# Patient Record
Sex: Female | Born: 1965 | Race: Black or African American | Hispanic: No | Marital: Single | Smoking: Never smoker
Health system: Southern US, Community
[De-identification: ages and names within clinical notes are randomized; demographics above are authoritative.]

## PROBLEM LIST (undated history)

## (undated) DIAGNOSIS — R011 Cardiac murmur, unspecified: Secondary | ICD-10-CM

## (undated) DIAGNOSIS — D509 Iron deficiency anemia, unspecified: Secondary | ICD-10-CM

## (undated) DIAGNOSIS — F419 Anxiety disorder, unspecified: Secondary | ICD-10-CM

## (undated) DIAGNOSIS — I38 Endocarditis, valve unspecified: Secondary | ICD-10-CM

## (undated) DIAGNOSIS — M1990 Unspecified osteoarthritis, unspecified site: Secondary | ICD-10-CM

## (undated) DIAGNOSIS — N83209 Unspecified ovarian cyst, unspecified side: Secondary | ICD-10-CM

## (undated) DIAGNOSIS — I10 Essential (primary) hypertension: Secondary | ICD-10-CM

## (undated) DIAGNOSIS — B1920 Unspecified viral hepatitis C without hepatic coma: Secondary | ICD-10-CM

## (undated) DIAGNOSIS — R000 Tachycardia, unspecified: Secondary | ICD-10-CM

## (undated) DIAGNOSIS — F1920 Other psychoactive substance dependence, uncomplicated: Secondary | ICD-10-CM

## (undated) HISTORY — DX: Tachycardia, unspecified: R00.0

## (undated) HISTORY — DX: Unspecified ovarian cyst, unspecified side: N83.209

## (undated) HISTORY — PX: DILATION AND CURETTAGE OF UTERUS: SHX78

## (undated) HISTORY — DX: Anxiety disorder, unspecified: F41.9

## (undated) HISTORY — DX: Cardiac murmur, unspecified: R01.1

---

## 2001-01-22 ENCOUNTER — Ambulatory Visit (HOSPITAL_COMMUNITY): Admission: RE | Admit: 2001-01-22 | Discharge: 2001-01-22 | Payer: Self-pay | Admitting: Gastroenterology

## 2001-01-22 ENCOUNTER — Encounter: Payer: Self-pay | Admitting: Gastroenterology

## 2001-02-07 ENCOUNTER — Encounter (INDEPENDENT_AMBULATORY_CARE_PROVIDER_SITE_OTHER): Payer: Self-pay | Admitting: *Deleted

## 2001-02-07 ENCOUNTER — Ambulatory Visit (HOSPITAL_COMMUNITY): Admission: RE | Admit: 2001-02-07 | Discharge: 2001-02-07 | Payer: Self-pay | Admitting: Gastroenterology

## 2001-02-07 ENCOUNTER — Encounter: Payer: Self-pay | Admitting: Gastroenterology

## 2001-05-16 ENCOUNTER — Emergency Department (HOSPITAL_COMMUNITY): Admission: EM | Admit: 2001-05-16 | Discharge: 2001-05-16 | Payer: Self-pay | Admitting: Emergency Medicine

## 2001-05-16 ENCOUNTER — Encounter: Payer: Self-pay | Admitting: Emergency Medicine

## 2002-09-18 ENCOUNTER — Emergency Department (HOSPITAL_COMMUNITY): Admission: EM | Admit: 2002-09-18 | Discharge: 2002-09-18 | Payer: Self-pay | Admitting: Emergency Medicine

## 2002-11-04 ENCOUNTER — Encounter: Admission: RE | Admit: 2002-11-04 | Discharge: 2002-11-04 | Payer: Self-pay | Admitting: *Deleted

## 2004-01-21 ENCOUNTER — Emergency Department (HOSPITAL_COMMUNITY): Admission: EM | Admit: 2004-01-21 | Discharge: 2004-01-21 | Payer: Self-pay | Admitting: Emergency Medicine

## 2009-05-04 ENCOUNTER — Encounter (INDEPENDENT_AMBULATORY_CARE_PROVIDER_SITE_OTHER): Payer: Self-pay | Admitting: *Deleted

## 2009-05-27 ENCOUNTER — Ambulatory Visit: Payer: Self-pay | Admitting: Gastroenterology

## 2009-06-17 ENCOUNTER — Ambulatory Visit (HOSPITAL_COMMUNITY): Admission: RE | Admit: 2009-06-17 | Discharge: 2009-06-17 | Payer: Self-pay | Admitting: Gastroenterology

## 2009-06-17 ENCOUNTER — Encounter (INDEPENDENT_AMBULATORY_CARE_PROVIDER_SITE_OTHER): Payer: Self-pay | Admitting: Interventional Radiology

## 2009-07-15 ENCOUNTER — Emergency Department (HOSPITAL_COMMUNITY): Admission: EM | Admit: 2009-07-15 | Discharge: 2009-07-15 | Payer: Self-pay | Admitting: Emergency Medicine

## 2009-07-23 ENCOUNTER — Encounter: Admission: RE | Admit: 2009-07-23 | Discharge: 2009-07-29 | Payer: Self-pay | Admitting: Emergency Medicine

## 2009-07-30 ENCOUNTER — Encounter (HOSPITAL_COMMUNITY): Admission: RE | Admit: 2009-07-30 | Discharge: 2009-09-01 | Payer: Self-pay | Admitting: Orthopaedic Surgery

## 2009-09-02 ENCOUNTER — Encounter (HOSPITAL_COMMUNITY): Admission: RE | Admit: 2009-09-02 | Discharge: 2009-10-02 | Payer: Self-pay | Admitting: Orthopaedic Surgery

## 2009-10-12 ENCOUNTER — Emergency Department (HOSPITAL_COMMUNITY): Admission: EM | Admit: 2009-10-12 | Discharge: 2009-10-12 | Payer: Self-pay | Admitting: Emergency Medicine

## 2010-02-19 ENCOUNTER — Emergency Department (HOSPITAL_COMMUNITY): Admission: EM | Admit: 2010-02-19 | Discharge: 2010-02-20 | Payer: Self-pay | Admitting: Emergency Medicine

## 2010-03-15 ENCOUNTER — Emergency Department (HOSPITAL_COMMUNITY): Admission: EM | Admit: 2010-03-15 | Discharge: 2010-03-15 | Payer: Self-pay | Admitting: Emergency Medicine

## 2010-11-03 ENCOUNTER — Emergency Department (HOSPITAL_COMMUNITY)
Admission: EM | Admit: 2010-11-03 | Discharge: 2010-11-03 | Payer: Self-pay | Source: Home / Self Care | Admitting: Emergency Medicine

## 2011-01-16 LAB — URINALYSIS, ROUTINE W REFLEX MICROSCOPIC
Ketones, ur: NEGATIVE mg/dL
Nitrite: NEGATIVE
Protein, ur: 30 mg/dL — AB

## 2011-01-17 LAB — URINALYSIS, ROUTINE W REFLEX MICROSCOPIC
Glucose, UA: NEGATIVE mg/dL
Specific Gravity, Urine: >1.03 — ABNORMAL HIGH (ref 1.005–1.030)
Urobilinogen, UA: 0.2 mg/dL (ref 0.0–1.0)
pH: 5 (ref 5.0–8.0)

## 2011-01-17 LAB — DIFFERENTIAL
Basophils Relative: 1 % (ref 0–1)
Lymphocytes Relative: 58 % — ABNORMAL HIGH (ref 12–46)
Monocytes Absolute: 0.8 10*3/uL (ref 0.1–1.0)
Monocytes Relative: 13 % — ABNORMAL HIGH (ref 3–12)
Neutro Abs: 1.7 10*3/uL (ref 1.7–7.7)

## 2011-01-17 LAB — CBC
Hemoglobin: 12.5 g/dL (ref 12.0–15.0)
MCHC: 35 g/dL (ref 30.0–36.0)
RBC: 3.91 MIL/uL (ref 3.87–5.11)

## 2011-01-17 LAB — URINE MICROSCOPIC-ADD ON

## 2011-01-17 LAB — BASIC METABOLIC PANEL
CO2: 24 mEq/L (ref 19–32)
Calcium: 9.1 mg/dL (ref 8.4–10.5)
GFR calc Af Amer: >60 mL/min (ref >60)
Sodium: 139 mEq/L (ref 135–145)

## 2011-01-17 LAB — RAPID URINE DRUG SCREEN, HOSP PERFORMED
Amphetamines: NOT DETECTED
Barbiturates: NOT DETECTED

## 2011-02-04 LAB — CBC
HCT: 36 % (ref 36.0–46.0)
Hemoglobin: 12.4 g/dL (ref 12.0–15.0)
MCHC: 34.3 g/dL (ref 30.0–36.0)
MCV: 95.5 fL (ref 78.0–100.0)
RDW: 13.9 % (ref 11.5–15.5)

## 2012-01-30 ENCOUNTER — Encounter (HOSPITAL_COMMUNITY): Payer: Self-pay | Admitting: *Deleted

## 2012-01-30 ENCOUNTER — Emergency Department (HOSPITAL_COMMUNITY)
Admission: EM | Admit: 2012-01-30 | Discharge: 2012-01-30 | Disposition: A | Discharge status: Home / Self Care | Payer: Medicaid Other | Attending: Emergency Medicine | Admitting: Emergency Medicine

## 2012-01-30 DIAGNOSIS — N750 Cyst of Bartholin's gland: Secondary | ICD-10-CM | POA: Insufficient documentation

## 2012-01-30 DIAGNOSIS — I10 Essential (primary) hypertension: Secondary | ICD-10-CM | POA: Insufficient documentation

## 2012-01-30 HISTORY — DX: Essential (primary) hypertension: I10

## 2012-01-30 MED ORDER — LIDOCAINE-EPINEPHRINE (PF) 1 %-1:200000 IJ SOLN
10.0000 mL | Freq: Once | INTRAMUSCULAR | Status: AC
  Administered 2012-01-30: 10 mL via INTRADERMAL
  Filled 2012-01-30: qty 10

## 2012-01-30 MED ORDER — OXYCODONE-ACETAMINOPHEN 5-325 MG PO TABS: 1.0000 | ORAL_TABLET | ORAL | Status: AC | PRN

## 2012-01-30 MED ORDER — OXYCODONE-ACETAMINOPHEN 5-325 MG PO TABS
ORAL_TABLET | ORAL | Status: AC
  Filled 2012-01-30: qty 1

## 2012-01-30 MED ORDER — FENTANYL CITRATE 0.05 MG/ML IJ SOLN
25.0000 ug | Freq: Once | INTRAMUSCULAR | Status: AC
  Administered 2012-01-30: 25 ug via INTRAVENOUS

## 2012-01-30 MED ORDER — ETOMIDATE 2 MG/ML IV SOLN
10.0000 mg | Freq: Once | INTRAVENOUS | Status: AC
  Administered 2012-01-30: 10 mg via INTRAVENOUS
  Filled 2012-01-30: qty 10

## 2012-01-30 MED ORDER — FENTANYL CITRATE 0.05 MG/ML IJ SOLN
50.0000 ug | Freq: Once | INTRAMUSCULAR | Status: AC
  Administered 2012-01-30: 50 ug via INTRAVENOUS
  Filled 2012-01-30: qty 2

## 2012-01-30 MED ORDER — ONDANSETRON HCL 4 MG/2ML IJ SOLN
4.0000 mg | Freq: Once | INTRAMUSCULAR | Status: AC
  Administered 2012-01-30: 4 mg via INTRAVENOUS
  Filled 2012-01-30: qty 2

## 2012-01-30 MED ORDER — OXYCODONE-ACETAMINOPHEN 5-325 MG PO TABS
1.0000 | ORAL_TABLET | Freq: Once | ORAL | Status: AC
  Administered 2012-01-30: 1 via ORAL

## 2012-01-30 MED ORDER — METRONIDAZOLE 500 MG PO TABS: 500.0000 mg | ORAL_TABLET | Freq: Two times a day (BID) | ORAL | Status: AC

## 2012-01-30 MED ORDER — CIPROFLOXACIN HCL 500 MG PO TABS: 500.0000 mg | ORAL_TABLET | Freq: Two times a day (BID) | ORAL | Status: AC

## 2012-01-30 NOTE — ED Notes (Signed)
Pt awake/ alert @ baseline at time of discharge. Pt will have S.O. Stay with her today/night at home. NAD. Pt able to tolerate po fluids with pain med.  NAD>

## 2012-01-30 NOTE — ED Notes (Signed)
Pt states abscess to vaginal area. First noticed 3- 4 weeks ago. Pain became worse over past two days. Nausea.

## 2012-01-30 NOTE — ED Provider Notes (Signed)
History   This chart was scribed for Hilario Quarry, MD by Clarita Crane. The patient was seen in room APA06/APA06. Patient's care was started at 0437.    CSN: [id number]  Arrival date & time 01/30/12  3295   First MD Initiated Contact with Patient 01/30/12 0700      Chief Complaint  Patient presents with  . Abscess    vaginal    (Consider location/radiation/quality/duration/timing/severity/associated sxs/prior treatment) HPI Brandi Giles is a 46 y.o. female who presents to the Emergency Department complaining of moderate to severe cyst to vaginal region onset several weeks ago and gradually worsening since with associated nausea and pain to region the past several days. Patient states that cyst was initially the size of a pimple and has grown since onset. Denies fever, chills, dysuria, vaginal bleeding, chance of pregnancy and history of similar symptoms. States she last drank/ate last night. Patient with h/o HTN, tubal ligation and denies h/o diabetes  Past Medical History  Diagnosis Date  . Hypertension     Past Surgical History  Procedure Date  . Cesarean section     No family history on file.  History  Substance Use Topics  . Smoking status: Never Smoker   . Smokeless tobacco: Not on file  . Alcohol Use: Yes     occasionally    OB History    Grav Para Term Preterm Abortions TAB SAB Ect Mult Living                  Review of Systems 10 Systems reviewed and all are negative for acute change except as noted in the HPI.   Allergies  Review of patient's allergies indicates no known allergies.  Home Medications  No current outpatient prescriptions on file.  BP 165/113  Pulse 85  Temp(Src) 98.2 F (36.8 C) (Oral)  Resp 20  Ht 5\' 5"  (1.651 m)  Wt 159 lb (72.122 kg)  BMI 26.46 kg/m2  SpO2 100%  LMP 01/26/2012  Physical Exam  Nursing note and vitals reviewed. Constitutional: She is oriented to person, place, and time. She appears well-developed and  well-nourished. No distress.  HENT:  Head: Normocephalic and atraumatic.  Eyes: EOM are normal. Pupils are equal, round, and reactive to light.  Neck: Neck supple. No tracheal deviation present.  Cardiovascular: Normal rate.   Pulmonary/Chest: Effort normal. No respiratory distress.  Abdominal: Soft. She exhibits no distension.  Genitourinary:       5cm Bartholin cyst to labia majora. Chaperone present for exam.   Musculoskeletal: Normal range of motion. She exhibits no edema.  Neurological: She is alert and oriented to person, place, and time. No sensory deficit.  Skin: Skin is warm and dry.  Psychiatric: She has a normal mood and affect. Her behavior is normal.    ED Course  Procedures (including critical care time)  CONSCIOUS SEDATION PROCEDURE NOTE: Patient identification was confirmed and patient consent was obtain verbally. The procedure was conducted at 8:01 AM and performed by Hilario Quarry, MD.. Anesthetic used: Etomidate, 10mg  IV Length of Time: 10 minutes Other medications administered: Fentanyl 50mg  IV followed by Fentanyl 25mg  IV Pre-procedure neuro examination revealed no deficits. Patient's airway remained patent, O2SAT adequate through procedure.  Vitals remained stable.  Patient tolerated procedure well without complications.  Post-procedure exam showed patient w/o cranial deficits.  Sensory and motor intact.  Patient returned to baseline prior to disposition. Instructions for care discussed verbally and pt provided with additional written instructions for homecare  and f/u. 8:34 AM Patient awake and alert post op instructions given. INCISION AND DRAINAGE PROCEDURE NOTE: Patient identification was confirmed and verbal consent was obtained. This procedure was performed by Hilario Quarry, MD at 8:01 AM. Site: Labia Majora Sterile procedures observed Anesthetic used (type and amt): 1% Lidocaine with Epinephrine, 2ml Blade size: 11 Drainage: serosanguinous,  moderate Complexity: Complex Site anesthetized, 3cm incision made over site, wound drained and explored loculations, rinsed with copious amounts of normal saline, wound packed with sterile gauze, covered with dry, sterile dressing.  Pt tolerated procedure well without complications.  Instructions for care discussed verbally and pt provided with additional written instructions for homecare and f/u.   DIAGNOSTIC STUDIES: Oxygen Saturation is 100% on room air, normal by my interpretation.    COORDINATION OF CARE: 7:16AM-Patient informed of current plan for treatment and evaluation and agrees with plan at this time. Patient informed of risks of conscious sedation. 8:01AM- Hilario Quarry, MD to bedside to perform conscious sedation and procedure.   Labs Reviewed - No data to display No results found.   No diagnosis found.   I personally performed the services described in this documentation, which was scribed in my presence. The recorded information has been reviewed and considered.         Hilario Quarry, MD 01/30/12 [phone]

## 2012-01-30 NOTE — ED Notes (Signed)
C/o ?vaginal abscess x 1 month, gradually getting larger and more painful.

## 2012-01-30 NOTE — ED Notes (Signed)
Ambu bag, suction, code cart at bedside prior to procedure. Pt on monitor. O2 @ 2L [place].

## 2012-01-30 NOTE — Discharge Instructions (Signed)
Bartholin's Cyst and Abscess  Bartholin's glands produce mucus through small openings just outside the opening of the vagina. The mucus helps with lubrication around the vagina during sexual intercourse. If the duct becomes clogged, the gland will swell and cause a bulge on the inside of the vagina. If this becomes big enough, it can be seen and felt on the outside of the vagina as well. Sometimes, the swelling will shrink away by itself. However, if the cyst becomes infected, the Bartholin's cyst fills with pus and becomes more swollen, red and painful and becomes a Bartholin's abscess. This usually requires antibiotic treatment and surgical drainage. Sometimes, with minor surgery under local anesthesia, a small tube is placed in the cyst or abscess wall. This allows continued drainage for up to 6 weeks. Minor surgery can make a new opening to replace the clogged duct and help prevent future cysts or abscess.  If the abscess occurs several times, a minor operation with local anesthesia is necessary to remove the Bartholin's gland completely or to make it drain better. Cutting open the gland and suturing the edges to make the opening of the gland bigger (marsupialization) may be needed and should usually be done by your obstetrician-gyncology physician. Antibiotics are usually prescribed for this condition. Take all antibiotics as prescribed. Make sure to finish them even if you are doing better. Take warm sitz baths for 20 minutes, 3 times a day. See your caregiver for follow-up care as recommended.  SEEK MEDICAL CARE IF:    You have increasing pain, swelling, or redness near the vagina.   You have vomiting or inability to tolerate medicines.   You have a fever.   You have uncontrolled bleeding from the vagina.  Document Released: 11/23/2004 Document Revised: 10/05/2011 Document Reviewed: 11/26/2009  ExitCare Patient Information 2012 [employer].

## 2012-01-30 NOTE — ED Notes (Signed)
Pt is awake, asking if procedure has been performed. Steward scale at baseline of 6. Able to follow commands and answer questions appropriately.

## 2012-08-07 ENCOUNTER — Other Ambulatory Visit (HOSPITAL_COMMUNITY): Payer: Self-pay | Admitting: Nurse Practitioner

## 2012-08-07 DIAGNOSIS — Z139 Encounter for screening, unspecified: Secondary | ICD-10-CM

## 2012-08-12 ENCOUNTER — Ambulatory Visit (HOSPITAL_COMMUNITY): Payer: Medicaid Other

## 2013-03-03 ENCOUNTER — Emergency Department (HOSPITAL_COMMUNITY)
Admission: EM | Admit: 2013-03-03 | Discharge: 2013-03-03 | Disposition: A | Discharge status: Home / Self Care | Payer: MEDICAID | Attending: Emergency Medicine | Admitting: Emergency Medicine

## 2013-03-03 ENCOUNTER — Encounter (HOSPITAL_COMMUNITY): Payer: Self-pay | Admitting: *Deleted

## 2013-03-03 DIAGNOSIS — G479 Sleep disorder, unspecified: Secondary | ICD-10-CM | POA: Insufficient documentation

## 2013-03-03 DIAGNOSIS — F1410 Cocaine abuse, uncomplicated: Secondary | ICD-10-CM

## 2013-03-03 DIAGNOSIS — Z3202 Encounter for pregnancy test, result negative: Secondary | ICD-10-CM | POA: Insufficient documentation

## 2013-03-03 DIAGNOSIS — I10 Essential (primary) hypertension: Secondary | ICD-10-CM | POA: Insufficient documentation

## 2013-03-03 DIAGNOSIS — F329 Major depressive disorder, single episode, unspecified: Secondary | ICD-10-CM | POA: Insufficient documentation

## 2013-03-03 DIAGNOSIS — F411 Generalized anxiety disorder: Secondary | ICD-10-CM | POA: Insufficient documentation

## 2013-03-03 DIAGNOSIS — R450 Nervousness: Secondary | ICD-10-CM | POA: Insufficient documentation

## 2013-03-03 DIAGNOSIS — Z8619 Personal history of other infectious and parasitic diseases: Secondary | ICD-10-CM | POA: Insufficient documentation

## 2013-03-03 DIAGNOSIS — F3289 Other specified depressive episodes: Secondary | ICD-10-CM | POA: Insufficient documentation

## 2013-03-03 HISTORY — DX: Unspecified viral hepatitis C without hepatic coma: B19.20

## 2013-03-03 LAB — CBC WITH DIFFERENTIAL/PLATELET
Basophils Relative: 0 % (ref 0–1)
Eosinophils Absolute: 0.2 10*3/uL (ref 0.0–0.7)
HCT: 29.1 % — ABNORMAL LOW (ref 36.0–46.0)
Hemoglobin: 9.2 g/dL — ABNORMAL LOW (ref 12.0–15.0)
MCH: 22.4 pg — ABNORMAL LOW (ref 26.0–34.0)
MCHC: 31.6 g/dL (ref 30.0–36.0)
MCV: 70.8 fL — ABNORMAL LOW (ref 78.0–100.0)
Monocytes Absolute: 0.6 10*3/uL (ref 0.1–1.0)
Monocytes Relative: 12 % (ref 3–12)

## 2013-03-03 LAB — BASIC METABOLIC PANEL
BUN: 19 mg/dL (ref 6–23)
Creatinine, Ser: 0.86 mg/dL (ref 0.50–1.10)
GFR calc Af Amer: >90 mL/min (ref >90)
GFR calc non Af Amer: 79 mL/min — ABNORMAL LOW (ref >90)
Glucose, Bld: 89 mg/dL (ref 70–99)

## 2013-03-03 LAB — URINALYSIS, ROUTINE W REFLEX MICROSCOPIC
Ketones, ur: NEGATIVE mg/dL
Leukocytes, UA: NEGATIVE
Nitrite: NEGATIVE
Protein, ur: NEGATIVE mg/dL

## 2013-03-03 LAB — POCT PREGNANCY, URINE: Preg Test, Ur: NEGATIVE

## 2013-03-03 LAB — RAPID URINE DRUG SCREEN, HOSP PERFORMED: Opiates: NOT DETECTED

## 2013-03-03 NOTE — ED Notes (Signed)
Pt here  For detox,  Has been using crack

## 2013-03-03 NOTE — ED Provider Notes (Signed)
History     This chart was scribed for Flint Melter, MD, MD by Smitty Pluck, ED Scribe. The patient was seen in room APA10/APA10 and the patient's care was started at 3:47 PM.   CSN: [id number]  Arrival date & time 03/03/13  1334       No chief complaint on file.    The history is provided by the patient and medical records. No language interpreter was used.   Brandi Giles is a 47 y.o. female who presents to the Emergency Department wanting detox from crack cocaine. She reports that she has been using crack for the past 4 days and has not been home with fiance. She last used crack 1 day ago. She reports that she is suppose to take anti-depression medicine but she does not have doctor to prescribe the medicine. She report that she is depressed and anxious. She mentions she is unable to sleep at night. She states she has lost 40 lbs within the past 2 months. She reports that she had tubal ligation 16 years ago. She is suppose to be seen at mental health facility 04-13-13 and she last went there last week for outpatient services. She states she was referred to ED. Pt denies SI, HI, hallucinations,  fever, chills, nausea, vomiting, diarrhea, weakness, cough, SOB and any other pain. She denies using any other illegal drugs and alcohol.    Past Medical History  Diagnosis Date  . Hypertension   . Hepatitis C     Past Surgical History  Procedure Laterality Date  . Cesarean section      History reviewed. No pertinent family history.  History  Substance Use Topics  . Smoking status: Never Smoker   . Smokeless tobacco: Not on file  . Alcohol Use: Yes     Comment: occasionally    OB History   Grav Para Term Preterm Abortions TAB SAB Ect Mult Living                  Review of Systems  Constitutional: Negative for fever and chills.  Psychiatric/Behavioral: Negative for suicidal ideas, hallucinations, confusion and self-injury. The patient is nervous/anxious.   All other  systems reviewed and are negative.    Allergies  Review of patient's allergies indicates no known allergies.  Home Medications   Current Outpatient Rx  Name  Route  Sig  Dispense  Refill  . naproxen sodium (ALEVE) 220 MG tablet   Oral   Take 660 mg by mouth once as needed (for headache pain).           BP 166/115  Pulse 86  Temp(Src) 97.2 F (36.2 C) (Oral)  Resp 22  Ht 5\' 5"  (1.651 m)  Wt 125 lb (56.7 kg)  BMI 20.8 kg/m2  SpO2 100%  LMP 01/01/2013  Physical Exam  Nursing note and vitals reviewed. Constitutional: She is oriented to person, place, and time. She appears well-developed and well-nourished.  HENT:  Head: Normocephalic and atraumatic.  Eyes: Conjunctivae and EOM are normal. Pupils are equal, round, and reactive to light.  Neck: Normal range of motion and phonation normal. Neck supple.  Cardiovascular: Normal rate, regular rhythm and intact distal pulses.   Pulmonary/Chest: Effort normal and breath sounds normal. She exhibits no tenderness.  Abdominal: Soft. She exhibits no distension. There is no tenderness. There is no guarding.  Musculoskeletal: Normal range of motion.  Neurological: She is alert and oriented to person, place, and time. She has normal strength. She  exhibits normal muscle tone.  Skin: Skin is warm and dry.  Psychiatric: She has a normal mood and affect. Her behavior is normal. Judgment and thought content normal.    ED Course  Procedures (including critical care time) DIAGNOSTIC STUDIES: Oxygen Saturation is 100% on room air, normal by my interpretation.   Filed Vitals:   03/03/13 1359  BP: 166/115  Pulse: 86  Temp: 97.2 F (36.2 C)  TempSrc: Oral  Resp: 22  Height: 5\' 5"  (1.651 m)  Weight: 125 lb (56.7 kg)  SpO2: 100%    I discussed the case with the on-call assessment crisis team. Patsy Lager, states that the patient should followup with [facility] tomorrow. She agrees that inpatient detoxification for cocaine, is not  indicated.   COORDINATION OF CARE: 3:50 PM Discussed ED treatment with pt and pt agrees.   4:01 PM Discussed post ED plans with pt.    Labs Reviewed  CBC WITH DIFFERENTIAL - Abnormal; Notable for the following:    Hemoglobin 9.2 (*)    HCT 29.1 (*)    MCV 70.8 (*)    MCH 22.4 (*)    RDW 17.0 (*)    Neutrophils Relative 39 (*)    All other components within normal limits  BASIC METABOLIC PANEL - Abnormal; Notable for the following:    Sodium 133 (*)    GFR calc non Af Amer 79 (*)    All other components within normal limits  ETHANOL  URINALYSIS, ROUTINE W REFLEX MICROSCOPIC  URINE RAPID DRUG SCREEN (HOSP PERFORMED)  POCT PREGNANCY, URINE      1. Cocaine abuse   2. Depression       MDM  Substance abuse, cocaine, depression. She is a chronic user. She is not suicidal or homicidal. She is stable for discharge. Doubt metabolic instability, serious bacterial infection or impending vascular collapse; the patient is stable for discharge.  Nursing Notes Reviewed/ Care Coordinated, and agree without changes. Applicable Imaging Reviewed.  Interpretation of Laboratory Data incorporated into ED treatment  Plan: Home Medications- none; Home Treatments- Avoid Cocaine; [facility]     I personally performed the services described in this documentation, which was scribed in my presence. The recorded information has been reviewed and is accurate.    Flint Melter, MD 03/03/13 [phone]

## 2013-03-10 ENCOUNTER — Emergency Department: Payer: Self-pay | Admitting: Emergency Medicine

## 2013-03-10 LAB — COMPREHENSIVE METABOLIC PANEL
Albumin: 4.2 g/dL (ref 3.4–5.0)
Alkaline Phosphatase: 56 U/L (ref 50–136)
Bilirubin,Total: 0.6 mg/dL (ref 0.2–1.0)
Calcium, Total: 9.1 mg/dL (ref 8.5–10.1)
EGFR (African American): >60
EGFR (Non-African Amer.): >60
Glucose: 95 mg/dL (ref 65–99)
Osmolality: 270 (ref 275–301)
Potassium: 4.1 mmol/L (ref 3.5–5.1)
SGPT (ALT): 168 U/L — ABNORMAL HIGH (ref 12–78)
Total Protein: 9.8 g/dL — ABNORMAL HIGH (ref 6.4–8.2)

## 2013-03-10 LAB — URINALYSIS, COMPLETE
Blood: NEGATIVE
Glucose,UR: NEGATIVE mg/dL (ref 0–75)
Hyaline Cast: 5
Nitrite: NEGATIVE
Ph: 6 (ref 4.5–8.0)
WBC UR: 15 /HPF (ref 0–5)

## 2013-03-10 LAB — DRUG SCREEN, URINE
Barbiturates, Ur Screen: NEGATIVE (ref ?–200)
Cannabinoid 50 Ng, Ur ~~LOC~~: NEGATIVE (ref ?–50)
Tricyclic, Ur Screen: NEGATIVE (ref ?–1000)

## 2013-03-10 LAB — TSH: Thyroid Stimulating Horm: 1.86 u[IU]/mL

## 2013-03-10 LAB — CBC
HCT: 29.9 % — ABNORMAL LOW (ref 35.0–47.0)
HGB: 9.4 g/dL — ABNORMAL LOW (ref 12.0–16.0)
MCH: 22.2 pg — ABNORMAL LOW (ref 26.0–34.0)
Platelet: 283 10*3/uL (ref 150–440)
RBC: 4.23 10*6/uL (ref 3.80–5.20)
WBC: 8.7 10*3/uL (ref 3.6–11.0)

## 2013-03-10 LAB — ETHANOL
Ethanol %: <0.003 % (ref 0.000–0.080)
Ethanol: <3 mg/dL

## 2013-03-10 LAB — SALICYLATE LEVEL: Salicylates, Serum: <1.7 mg/dL

## 2013-03-10 LAB — ACETAMINOPHEN LEVEL: Acetaminophen: <2 ug/mL

## 2013-04-04 ENCOUNTER — Inpatient Hospital Stay (HOSPITAL_COMMUNITY)
Admission: AD | Admit: 2013-04-04 | Discharge: 2013-04-08 | DRG: 897 | Disposition: A | Discharge status: Home / Self Care | Payer: Medicaid Other | Source: Intra-hospital | Attending: Psychiatry | Admitting: Psychiatry

## 2013-04-04 ENCOUNTER — Emergency Department (HOSPITAL_COMMUNITY)
Admission: EM | Admit: 2013-04-04 | Discharge: 2013-04-04 | Disposition: A | Discharge status: Other Acute Inpatient Hospital | Payer: Medicaid Other | Source: Home / Self Care | Attending: Emergency Medicine | Admitting: Emergency Medicine

## 2013-04-04 ENCOUNTER — Encounter (HOSPITAL_COMMUNITY): Payer: Self-pay

## 2013-04-04 DIAGNOSIS — R45851 Suicidal ideations: Secondary | ICD-10-CM | POA: Insufficient documentation

## 2013-04-04 DIAGNOSIS — F1410 Cocaine abuse, uncomplicated: Secondary | ICD-10-CM | POA: Insufficient documentation

## 2013-04-04 DIAGNOSIS — Z8619 Personal history of other infectious and parasitic diseases: Secondary | ICD-10-CM | POA: Insufficient documentation

## 2013-04-04 DIAGNOSIS — I10 Essential (primary) hypertension: Secondary | ICD-10-CM | POA: Diagnosis present

## 2013-04-04 DIAGNOSIS — F3289 Other specified depressive episodes: Secondary | ICD-10-CM | POA: Insufficient documentation

## 2013-04-04 DIAGNOSIS — Z862 Personal history of diseases of the blood and blood-forming organs and certain disorders involving the immune mechanism: Secondary | ICD-10-CM | POA: Insufficient documentation

## 2013-04-04 DIAGNOSIS — Z79899 Other long term (current) drug therapy: Secondary | ICD-10-CM

## 2013-04-04 DIAGNOSIS — Z3202 Encounter for pregnancy test, result negative: Secondary | ICD-10-CM | POA: Insufficient documentation

## 2013-04-04 DIAGNOSIS — F329 Major depressive disorder, single episode, unspecified: Secondary | ICD-10-CM | POA: Diagnosis present

## 2013-04-04 DIAGNOSIS — F1414 Cocaine abuse with cocaine-induced mood disorder: Secondary | ICD-10-CM

## 2013-04-04 HISTORY — DX: Other psychoactive substance dependence, uncomplicated: F19.20

## 2013-04-04 HISTORY — DX: Iron deficiency anemia, unspecified: D50.9

## 2013-04-04 LAB — ETHANOL: Alcohol, Ethyl (B): 14 mg/dL — ABNORMAL HIGH (ref 0–11)

## 2013-04-04 LAB — BASIC METABOLIC PANEL
BUN: 14 mg/dL (ref 6–23)
CO2: 19 mEq/L (ref 19–32)
Chloride: 97 mEq/L (ref 96–112)
Creatinine, Ser: 1 mg/dL (ref 0.50–1.10)
Glucose, Bld: 105 mg/dL — ABNORMAL HIGH (ref 70–99)

## 2013-04-04 LAB — CBC WITH DIFFERENTIAL/PLATELET
Basophils Absolute: 0 10*3/uL (ref 0.0–0.1)
HCT: 28.4 % — ABNORMAL LOW (ref 36.0–46.0)
Hemoglobin: 8.9 g/dL — ABNORMAL LOW (ref 12.0–15.0)
Lymphocytes Relative: 42 % (ref 12–46)
Lymphs Abs: 2.7 10*3/uL (ref 0.7–4.0)
Monocytes Absolute: 0.9 10*3/uL (ref 0.1–1.0)
Monocytes Relative: 14 % — ABNORMAL HIGH (ref 3–12)
Neutro Abs: 2.7 10*3/uL (ref 1.7–7.7)
RBC: 3.94 MIL/uL (ref 3.87–5.11)
WBC: 6.5 10*3/uL (ref 4.0–10.5)

## 2013-04-04 LAB — URINE MICROSCOPIC-ADD ON

## 2013-04-04 LAB — URINALYSIS, ROUTINE W REFLEX MICROSCOPIC
Glucose, UA: NEGATIVE mg/dL
Leukocytes, UA: NEGATIVE

## 2013-04-04 LAB — RAPID URINE DRUG SCREEN, HOSP PERFORMED
Amphetamines: NOT DETECTED
Benzodiazepines: NOT DETECTED

## 2013-04-04 MED ORDER — MAGNESIUM HYDROXIDE 400 MG/5ML PO SUSP: 30.0000 mL | Freq: Every day | ORAL | Status: DC | PRN

## 2013-04-04 MED ORDER — LORAZEPAM 1 MG PO TABS
2.0000 mg | ORAL_TABLET | Freq: Once | ORAL | Status: AC
  Administered 2013-04-04: 2 mg via ORAL
  Filled 2013-04-04: qty 2

## 2013-04-04 MED ORDER — NICOTINE 21 MG/24HR TD PT24: 21.0000 mg | MEDICATED_PATCH | Freq: Every day | TRANSDERMAL | Status: DC

## 2013-04-04 MED ORDER — ACETAMINOPHEN 325 MG PO TABS: 650.0000 mg | ORAL_TABLET | Freq: Four times a day (QID) | ORAL | Status: DC | PRN

## 2013-04-04 MED ORDER — ALUM & MAG HYDROXIDE-SIMETH 200-200-20 MG/5ML PO SUSP: 30.0000 mL | ORAL | Status: DC | PRN

## 2013-04-04 MED ORDER — TRAZODONE HCL 50 MG PO TABS
50.0000 mg | ORAL_TABLET | Freq: Every evening | ORAL | Status: DC | PRN
  Administered 2013-04-04 – 2013-04-07 (×4): 50 mg via ORAL
  Filled 2013-04-04 (×3): qty 1
  Filled 2013-04-04: qty 6
  Filled 2013-04-04: qty 1

## 2013-04-04 NOTE — ED Notes (Signed)
Pt states she has been smoking crack for about 20 years, states she was here sometime in the past week or so to get help, but was sent out and told there was no place for her to go.  Pt states she did not follow up because her medicaid wouldn't pay.  Pt states if she cannot get treatment and is sent home, she will kill herself.

## 2013-04-04 NOTE — Progress Notes (Signed)
Pt accepted by [facility] to Dr Elsie Saas room number to ber assigned.  Assessment will call when room is ready.  See support paperwork.

## 2013-04-04 NOTE — BH Assessment (Signed)
Assessment Note   Brandi Giles is an 47 y.o. female.   PT REPORTED TO [place] ER AFTER A 4 DAY CRACK/COCAINE BINGE. SHE HAS TRIED TO STOP ON HER OWN TO NO AVAIL.  SHE RECENTLY PRESENTED TO THE ER FOR HELP AND WAS GIVEN REFERRAL TO [facility]. HER APPOINTMENT IS NOT UNTIL July.  SHE WAS PREVIOUSLY TREATED FOR HER DEPRESSION BY HER PCP. DR [facility] BUT HAS NOT TAKEN HER WELLBUTRIN, ZOLOFT NOR PAXIL RECENTLY. SHE REPORTS HER OWN EFFORTS TO QUIT HAVE BEEN UNSUCCESSFUL.  PT REPORTS FEELING MORE DEPRESSED DURING THIS 4 DAY BINGE AND FEELS IF SHE GOES HOME SHE WILL KILL HERSELF BY OVERDOSING ON PILLS AS SHE NO LONGER WANTS TO LIVE THIS WAY.   SHE HAS BEEN USING FOR 20 YEARS WITH NO SIGNIFICANT DATES OF SOBRIETY.  SHE DENIES H/I AND IS NOT PSYCHOTIC NOR DELUSIONAL.    Axis I: Major Depression, Recurrent severe Crack/Cocaine Abuse Axis II: Deferred Axis III:  Past Medical History  Diagnosis Date  . Hypertension   . Hepatitis C   . Drug abuse and dependence    Axis IV: economic problems, occupational problems and other psychosocial or environmental problems Axis V: 11-20 some danger of hurting self or others possible OR occasionally fails to maintain minimal personal hygiene OR gross impairment in communication  Past Medical History:  Past Medical History  Diagnosis Date  . Hypertension   . Hepatitis C   . Drug abuse and dependence     Past Surgical History  Procedure Laterality Date  . Cesarean section      Family History: No family history on file.  Social History:  reports that she has never smoked. She does not have any smokeless tobacco history on file. She reports that  drinks alcohol. She reports that she uses illicit drugs (Cocaine).  Additional Social History:  Alcohol / Drug Use Pain Medications: na Prescriptions: na Over the Counter: na History of alcohol / drug use?: Yes Longest period of sobriety (when/how long): na Negative Consequences of Use: Financial;Personal  relationships Substance #1 Name of Substance 1: Crack/cocaine 1 - Age of First Use: 27 1 - Amount (size/oz): 3 grams  1 - Frequency: daily 1 - Duration: 2 years 1 - Last Use / Amount: this morning  last of a 4 day binge  CIWA: CIWA-Ar BP: 114/82 mmHg Pulse Rate: 88 COWS:    Allergies: No Known Allergies  Home Medications:  (Not in a hospital admission)  OB/GYN Status:  No LMP recorded.  General Assessment Data Location of Assessment: [facility] ACT Assessment: Yes Living Arrangements: Non-relatives/Friends Can pt return to current living arrangement?: Yes Admission Status: Voluntary Is patient capable of signing voluntary admission?: Yes [facility] Referral Source: MD (DR Fonnie Jarvis)     Risk to self Suicidal Ideation: Yes-Currently Present Suicidal Intent: Yes-Currently Present Is patient at risk for suicide?: Yes Suicidal Plan?: Yes-Currently Present Specify Current Suicidal Plan: TO OVERDOSE ON PILLS Access to Means: Yes Specify Access to Suicidal Means: CAN GET PILLS What has been your use of drugs/alcohol within the last 12 months?: CRACK/COCAINE Previous Attempts/Gestures: Yes How many times?: 2 Other Self Harm Risks: NA Triggers for Past Attempts: Family contact;Other personal contacts;Other (Comment) (COCAINE USE) Intentional Self Injurious Behavior: None Family Suicide History: No (MOTHER ATTEMPTED SUICIDE BUT SURVIVED) Recent stressful life event(s): Financial Problems;Other (Comment) (UNABLE TO STOP USING CRACK/COCAINE) Persecutory voices/beliefs?: No Depression: Yes Depression Symptoms: Despondent;Insomnia;Tearfulness;Isolating;Fatigue;Guilt;Loss of interest in usual pleasures;Feeling worthless/self pity;Feeling  angry/irritable Substance abuse history and/or treatment for substance abuse?: Yes Suicide prevention information given to non-admitted patients: Not applicable  Risk to Others Homicidal Ideation: No Thoughts of  Harm to Others: No Current Homicidal Intent: No Current Homicidal Plan: No Access to Homicidal Means: No Identified Victim: NA History of harm to others?: No Assessment of Violence: None Noted Violent Behavior Description: NA Does patient have access to weapons?: No Criminal Charges Pending?: No Does patient have a court date: No  Psychosis Hallucinations: None noted Delusions: None noted  Mental Status Report Appear/Hygiene: Disheveled Eye Contact: Fair Motor Activity: Freedom of movement Speech: Logical/coherent;Soft Level of Consciousness: Alert Mood: Depressed;Despair;Guilty;Helpless;Sad;Worthless, low self-esteem Affect: Appropriate to circumstance;Depressed;Fearful;Sad Anxiety Level: Minimal Thought Processes: Coherent;Relevant Judgement: Impaired Orientation: Person;Place;Time;Situation Obsessive Compulsive Thoughts/Behaviors: Minimal  Cognitive Functioning Concentration: Decreased Memory: Recent Intact;Remote Intact IQ: Average Insight: Poor Impulse Control: Poor Appetite: Fair Weight Loss: 0 Weight Gain: 0 Sleep: Decreased Total Hours of Sleep: 2 (ON 4 DAY COCAINE BINGE) Vegetative Symptoms: None  ADLScreening [facility] Assessment Services) Patient's cognitive ability adequate to safely complete daily activities?: Yes Patient able to express need for assistance with ADLs?: Yes Independently performs ADLs?: Yes (appropriate for developmental age)  Abuse/Neglect [facility]) Physical Abuse: Denies Verbal Abuse: Denies Sexual Abuse: Denies  Prior Inpatient Therapy Prior Inpatient Therapy: Yes Prior Therapy Dates: AGE 27. AND 2 YRS AGO Prior Therapy Facilty/Provider(s): [facility] IN [place] AT AGE 27 AND [place] [facility] 2 YRS AGO Reason for Treatment: SUICIDE ATTEMPT. AND SUICIDAL IDEATIONS  Prior Outpatient Therapy Prior Outpatient Therapy: No Prior Therapy Dates: NA Prior Therapy Facilty/Provider(s): NA Reason for Treatment: NA  ADL Screening (condition at  time of admission) Patient's cognitive ability adequate to safely complete daily activities?: Yes Patient able to express need for assistance with ADLs?: Yes Independently performs ADLs?: Yes (appropriate for developmental age) Weakness of Legs: None Weakness of Arms/Hands: None  Home Assistive Devices/Equipment Home Assistive Devices/Equipment: None  Therapy Consults (therapy consults require a physician order) PT Evaluation Needed: No OT Evalulation Needed: No SLP Evaluation Needed: No Abuse/Neglect Assessment (Assessment to be complete while patient is alone) Physical Abuse: Denies Verbal Abuse: Denies Sexual Abuse: Denies Exploitation of patient/patient's resources: Denies Self-Neglect: Denies Values / Beliefs Cultural Requests During Hospitalization: None Spiritual Requests During Hospitalization: None Consults Spiritual Care Consult Needed: No Social Work Consult Needed: No Merchant navy officer (For Healthcare) Advance Directive: Patient does not have advance directive;Patient would not like information Pre-existing out of facility DNR order (yellow form or pink MOST form): No    Additional Information 1:1 In Past 12 Months?: No CIRT Risk: No Elopement Risk: No Does patient have medical clearance?: Yes     Disposition: REFERRED TO [facility] Disposition Initial Assessment Completed for this Encounter: Yes Disposition of Patient: Inpatient treatment program Type of inpatient treatment program: Adult (REFERRED TO [facility])  On Site Evaluation by:   Reviewed with Physician:  DR AVWUJW AND DR Lynelle Doctor   Hattie Perch Winford 04/04/2013 8:51 AM

## 2013-04-04 NOTE — ED Notes (Signed)
Pt resting calmly w/ eyes closed. Rise & fall of the chest noted. Sitter at bedside. Bed in low position, side rails up x2. NAD noted at this time.  

## 2013-04-04 NOTE — ED Provider Notes (Signed)
Brandi Giles, ACT states accepted at [facility] but is waiting for a bed  Ward Givens, MD 04/04/13 1336

## 2013-04-04 NOTE — BH Assessment (Signed)
     Received referral for this patient from [facility], there is no appropriate bed at this time. [facility] Assessment Progress Note

## 2013-04-04 NOTE — ED Notes (Signed)
Attempted to call report to [facility] Adult Unit. Charge RN to call back. Report given to [facility].

## 2013-04-04 NOTE — ED Provider Notes (Signed)
This chart was scribed for Ward Givens, MD by Jiles Prows, ED Scribe. The patient was seen in room APA03/APA03 and the patient's care was started at 8:42 AM.   8:42 AM - Pt reports she is feeling better than when she came in.  Pt reports feeling minor withdrawal.  Discussed ED treatment with pt. She states she has talked to [place], [place] and she is trying to get her placement and pt agrees. Pt states that 10 years ago she went to rehab and stayed sober for 8 months. Pt has had telepsych consult by Dr Leretha Pol who recommends inpatient admission.  Devoria Albe, MD, FACEP   I personally performed the services described in this documentation, which was scribed in my presence. The recorded information has been reviewed and considered.  Devoria Albe, MD, Armando Gang    Ward Givens, MD 04/04/13 [phone]

## 2013-04-04 NOTE — ED Notes (Signed)
[facility] here to pick up pt 

## 2013-04-04 NOTE — BH Assessment (Signed)
[facility] Assessment Progress Note  Per Adult Unit, pt has been assigned to Rm [id number].  Doylene Canning, MA Assessment Counselor 04/04/2013 @ 18:26

## 2013-04-04 NOTE — ED Notes (Signed)
Pt asleep when this nurse entered the room. Pt came to the ER to get help from cocaine. States she has been using for 20 years, last used 2 hours ago. Pt states was seen here & DC home earlier this months & if she goes home again that she would kill herself. Pt in paper scrubs w/ sitter watching pt.

## 2013-04-04 NOTE — ED Provider Notes (Signed)
History     CSN: [id number]  Arrival date & time 04/04/13  0210   First MD Initiated Contact with Patient 04/04/13 [phone]      Chief Complaint  Patient presents with  . V70.1    (Consider location/radiation/quality/duration/timing/severity/associated sxs/prior treatment) HPI This 47 year old female has smoked crack cocaine for years and wants detox, she also states she's anxiety depression is no suicidal, the last time she was in the emergency department requesting detox from crack cocaine she was not suicidal but she has tonight now, she denies any threats or others, she denies any specific suicidal plan, she denies any hallucinations, she is no fever headache chest pain shortness breath abdominal pain vomiting or other concerns. There is no treatment prior to arrival. Past Medical History  Diagnosis Date  . Hypertension   . Hepatitis C   . Drug abuse and dependence   . Iron deficiency anemia     Past Surgical History  Procedure Laterality Date  . Cesarean section      No family history on file.  History  Substance Use Topics  . Smoking status: Never Smoker   . Smokeless tobacco: Not on file  . Alcohol Use: Yes     Comment: occasionally    OB History   Grav Para Term Preterm Abortions TAB SAB Ect Mult Living                  Review of Systems 10 Systems reviewed and are negative for acute change except as noted in the HPI. Allergies  Review of patient's allergies indicates no known allergies.  Home Medications   No current outpatient prescriptions on file.  BP 141/95  Pulse 83  Temp(Src) 98.7 F (37.1 C) (Oral)  Resp 18  Ht 5\' 5"  (1.651 m)  Wt 140 lb (63.504 kg)  BMI 23.3 kg/m2  SpO2 100%  LMP 03/03/2013  Physical Exam  Nursing note and vitals reviewed. Constitutional: She is oriented to person, place, and time.  Awake, alert, nontoxic appearance.  HENT:  Head: Atraumatic.  Eyes: Right eye exhibits no discharge. Left eye exhibits no discharge.   Neck: Neck supple.  Cardiovascular: Normal rate and regular rhythm.   No murmur heard. Pulmonary/Chest: Effort normal and breath sounds normal. No respiratory distress. She has no wheezes. She has no rales. She exhibits no tenderness.  Abdominal: Soft. Bowel sounds are normal. She exhibits no distension and no mass. There is no tenderness. There is no rebound and no guarding.  Musculoskeletal: She exhibits no tenderness.  Baseline ROM, no obvious new focal weakness.  Neurological: She is alert and oriented to person, place, and time.  Mental status and motor strength appears baseline for patient and situation.  Skin: No rash noted.  Psychiatric:  Anxious depressed tearful suicidal without homicidal ideation or hallucinations    ED Course  Procedures (including critical care time) Telepsych consulted (results pending) and ACT in ED to see Pt. [id number]   Labs Reviewed  CBC WITH DIFFERENTIAL - Abnormal; Notable for the following:    Hemoglobin 8.9 (*)    HCT 28.4 (*)    MCV 72.1 (*)    MCH 22.6 (*)    RDW 18.2 (*)    Neutrophils Relative % 41 (*)    Monocytes Relative 14 (*)    All other components within normal limits  BASIC METABOLIC PANEL - Abnormal; Notable for the following:    Sodium 131 (*)    Glucose, Bld 105 (*)    GFR  calc non Af Amer 66 (*)    GFR calc Af Amer 76 (*)    All other components within normal limits  ETHANOL - Abnormal; Notable for the following:    Alcohol, Ethyl (B) 14 (*)    All other components within normal limits  URINE RAPID DRUG SCREEN (HOSP PERFORMED) - Abnormal; Notable for the following:    Cocaine POSITIVE (*)    All other components within normal limits  URINALYSIS, ROUTINE W REFLEX MICROSCOPIC - Abnormal; Notable for the following:    Hgb urine dipstick TRACE (*)    Ketones, ur TRACE (*)    Protein, ur TRACE (*)    All other components within normal limits  URINE MICROSCOPIC-ADD ON - Abnormal; Notable for the following:    Squamous  Epithelial / LPF MANY (*)    Bacteria, UA MANY (*)    Casts HYALINE CASTS (*)    All other components within normal limits  PREGNANCY, URINE   No results found.   1. Cocaine abuse   2. Depression   3. Suicidal ideation       MDM  Patient understand and agree with initial ED impression and plan with expectations set for ED visit. Dispo pending.        Hurman Horn, MD 04/06/13 [phone]

## 2013-04-04 NOTE — ED Notes (Signed)
Orange key ring with 5 keys (including scooter key) given to pt fiance per pt request by patient representative Jose Persia .

## 2013-04-05 ENCOUNTER — Encounter (HOSPITAL_COMMUNITY): Payer: Self-pay | Admitting: Medical

## 2013-04-05 DIAGNOSIS — F1994 Other psychoactive substance use, unspecified with psychoactive substance-induced mood disorder: Secondary | ICD-10-CM

## 2013-04-05 LAB — OCCULT BLOOD X 1 CARD TO LAB, STOOL: Fecal Occult Bld: NEGATIVE

## 2013-04-05 MED ORDER — CITALOPRAM HYDROBROMIDE 10 MG PO TABS
10.0000 mg | ORAL_TABLET | Freq: Every day | ORAL | Status: DC
  Administered 2013-04-05 – 2013-04-08 (×4): 10 mg via ORAL
  Filled 2013-04-05: qty 3
  Filled 2013-04-05 (×5): qty 1

## 2013-04-05 MED ORDER — HYDROXYZINE HCL 25 MG PO TABS
25.0000 mg | ORAL_TABLET | Freq: Four times a day (QID) | ORAL | Status: DC | PRN
  Administered 2013-04-05 – 2013-04-08 (×5): 25 mg via ORAL
  Filled 2013-04-05: qty 1
  Filled 2013-04-05: qty 10

## 2013-04-05 NOTE — Progress Notes (Signed)
Patient admitted voluntarily requesting help to detox and stop her crack cocaine abuse. Patient reports using crack cocaine for 20 years and states, " I'm tired and want to stop but can't. Patient reports suffering from depression for years and has been prescribed medications but just stopped taking them. Patient reports that her depression has increased along with decreased appetite, crying spells, and feeling worthless. Patient reports she has a supportive fiance. Patient is tearful during admission. Patient reports passive si and verbally contracts for safety, she denies hi/a/v hallucinations. Patient oriented to unit, meal and fluids given, safety maintained on unit with 15 min checks

## 2013-04-05 NOTE — Progress Notes (Signed)
Psychoeducational Group Note  Date: 04/05/2013 Time:  1015  Group Topic/Focus:  Identifying Needs:   The focus of this group is to help patients identify their personal needs that have been historically problematic and identify healthy behaviors to address their needs.  Participation Level:  Active  Participation Quality:  Appropriate  Affect:  Appropriate  Cognitive:  Appropriate  Insight:  Improving  Engagement in Group:  Engaged  Additional Comments:  Listened attentively and spoke up during the group.  Brandi Giles 
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
## 2013-04-05 NOTE — Progress Notes (Signed)
D) Pt states that she is still having thoughts of suicide. Rates her depression and her hopelessness at a 4. States she really wants to stop using. Is aware that she has Hepatitis C and using now is just not a good thing for her. Is also aware that her labs are low and is concerned about it. Wants to be healthy for herself and her family. A) Given support, reassurance and praise. Encouraged to give a stool sample for guiac and to think about all the reasons she shouldn't use anymore. j R) Continues to have thoughts of SI, contracts for safety.
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
## 2013-04-05 NOTE — Progress Notes (Signed)
Psychoeducational Group Note    Date: 04/05/2013 Time:  0915   Goal Setting Purpose of Group: To be able to set a goal that is measurable and that can be accomplished in one day Participation Level:  Did not attend [facility]

## 2013-04-05 NOTE — [facility] Suicide Risk Assessment (Signed)
Suicide Risk Assessment  Admission Assessment     Nursing information obtained from:  Patient Demographic factors:  Divorced or widowed;Unemployed Current Mental Status:  Suicidal ideation indicated by patient;Self-harm thoughts Loss Factors:  Legal issues;Financial problems / change in socioeconomic status Historical Factors:  Prior suicide attempts;Family history of suicide;Family history of mental illness or substance abuse Risk Reduction Factors:  Responsible for children under 22 years of age;Sense of responsibility to family;Positive social support  CLINICAL FACTORS:   Alcohol/Substance Abuse/Dependencies  COGNITIVE FEATURES THAT CONTRIBUTE TO RISK:  Closed-mindedness    SUICIDE RISK:   Moderate:  Frequent suicidal ideation with limited intensity, and duration, some specificity in terms of plans, no associated intent, good self-control, limited dysphoria/symptomatology, some risk factors present, and identifiable protective factors, including available and accessible social support.  PLAN OF CARE:  Detox  Start celexa for depression  I certify that inpatient services furnished can reasonably be expected to improve the patient's condition.  Wonda Cerise 04/05/2013, 11:31 AM

## 2013-04-05 NOTE — H&P (Signed)
  Pt was seen by me today and I agree with the key elements documented in H&P.  
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
## 2013-04-06 DIAGNOSIS — F1414 Cocaine abuse with cocaine-induced mood disorder: Secondary | ICD-10-CM | POA: Diagnosis present

## 2013-04-06 DIAGNOSIS — F332 Major depressive disorder, recurrent severe without psychotic features: Secondary | ICD-10-CM

## 2013-04-06 LAB — RAPID HIV SCREEN (WH-MAU): Rapid HIV Screen: NONREACTIVE

## 2013-04-06 LAB — HIV ANTIBODY (ROUTINE TESTING W REFLEX): HIV: NONREACTIVE

## 2013-04-06 MED ORDER — CIPROFLOXACIN HCL 250 MG PO TABS
250.0000 mg | ORAL_TABLET | ORAL | Status: DC
  Administered 2013-04-06 – 2013-04-08 (×5): 250 mg via ORAL
  Filled 2013-04-06 (×9): qty 1

## 2013-04-06 MED ORDER — ENSURE COMPLETE PO LIQD
237.0000 mL | ORAL | Status: DC
  Administered 2013-04-06 – 2013-04-08 (×3): 237 mL via ORAL

## 2013-04-06 MED ORDER — FERROUS SULFATE 325 (65 FE) MG PO TABS
325.0000 mg | ORAL_TABLET | Freq: Two times a day (BID) | ORAL | Status: DC
  Administered 2013-04-06 – 2013-04-08 (×5): 325 mg via ORAL
  Filled 2013-04-06 (×3): qty 1
  Filled 2013-04-06: qty 3
  Filled 2013-04-06 (×4): qty 1
  Filled 2013-04-06: qty 3
  Filled 2013-04-06: qty 1

## 2013-04-06 NOTE — Progress Notes (Signed)
Adult Psychoeducational Group Note  Date:  04/06/2013 Time:  4:43 PM  Group Topic/Focus:  Goals Group:   The focus of this group is to help patients establish daily goals to achieve during treatment and discuss how the patient can incorporate goal setting into their daily lives to aide in recovery.  Participation Level:  Active  Participation Quality:  Appropriate  Affect:  Appropriate  Cognitive:  Alert  Insight: Good  Engagement in Group:  Engaged  Modes of Intervention:  Activity  Additional Comments:    Starleen Arms 04/06/2013, 4:43 PM

## 2013-04-06 NOTE — Progress Notes (Signed)
Psychoeducational Group Note  Date:  04/06/2013 Time:  1015  Group Topic/Focus:  Making Healthy Choices:   The focus of this group is to help patients identify negative/unhealthy choices they were using prior to admission and identify positive/healthier coping strategies to replace them upon discharge.  Participation Level:  Active  Participation Quality:  Appropriate  Affect:  Flat  Cognitive:  Oriented  Insight:  Improving  Engagement in Group:  Engaged  Additional Comments:    Dione Housekeeper 04/06/2013

## 2013-04-06 NOTE — Progress Notes (Signed)
D) Pt relieved that her HIV test came back non-reactive. States "it's a big load off my mind". States "I have Hepatitis C and I need to start living in a healthier way. I can't keep doing what I was doing". Rates her depression and hopelessness both at a 5 and admits to thoughts of SI on and off. Affect is a little less intense than yesterday. A) Given support and provdied with a 1:1.  R) Contracts for safety on the unit.

## 2013-04-06 NOTE — Progress Notes (Signed)
Patient has been resting in the bed at beginning of shift and did attend wrap up group this evening. Patient reports that she has slept a lot today and feels that she needed it since she had been on a 4 day binge. Patient appears sad and anxious. Patient requested her trazadone shortly after group reporting she wanted to go to bed early. Patient voiced no other complaints, denies si/hi/a/v hallucinations, safety maintained on unit, will continue to monitor.

## 2013-04-06 NOTE — [facility] Group Notes (Signed)
[facility] Group Notes:  (Clinical Social Work)  04/06/2013   3:00-4:00PM  Summary of Progress/Problems:   The main focus of today's process group was to   identify the patient's current support system and decide on other supports that can be put in place.  Four definitions/levels of support were discussed.  An emphasis was placed on using counselor, doctor, therapy groups, 12-step groups, and problem-specific support groups to expand supports.  The group also discussed the stigma that goes along with mental illness and how to be self-protective while asking for additional supports from friends/church members/family.  The patient verbalized understanding of the importance of increasing support to prevent relapse.  She stated she is willing to do what is needed to get well, and she wants to start by going from this facility to a rehabilitation facility.  She stated she has no intentions of leaving here without going somewhere else, that she is here "until then."  Type of Therapy:  Process Group  Participation Level:  Minimal  Participation Quality:  Attentive and Sharing  Affect:  Blunted  Cognitive:  Appropriate and Oriented  Insight:  Engaged  Engagement in Therapy:  Engaged  Modes of Intervention:  Education,  Support and [employer], LCSW 04/06/2013, 4:54 PM

## 2013-04-06 NOTE — Progress Notes (Signed)
[facility] MD Progress Note  04/06/2013 2:03 PM Brandi Giles  MRN:  [id number] Subjective:  Patient reports that she is doing better today. Rates depression and anxiety at five. Appears to be visibly less anxious today as evidenced by less psychomotor activity during assessment. Reports taking prn vistaril which was helpful with her anxiety. Patient is optimistic about starting on celexa and finding a rehab to help with her cocaine addiction.  Diagnosis:   Axis I: Major Depression, Recurrent severe and Substance Induced Mood Disorder Axis II: Deferred Axis III:  Past Medical History  Diagnosis Date  . Hypertension   . Hepatitis C   . Drug abuse and dependence   . Iron deficiency anemia    Axis IV: economic problems, occupational problems, other psychosocial or environmental problems and problems with primary support group Axis V: 51-60 moderate symptoms  ADL's:  Intact  Sleep: Good  Appetite:  Good  Suicidal Ideation:  Passive SI to overdose but reports "They are getting better."  Homicidal Ideation:  Denies AEB (as evidenced by):  Psychiatric Specialty Exam: Review of Systems  Constitutional: Negative.   HENT: Negative.   Eyes: Negative.   Respiratory: Negative.   Cardiovascular: Negative.   Gastrointestinal: Negative.   Genitourinary: Negative.   Musculoskeletal: Negative.   Skin: Negative.   Neurological: Negative.   Endo/Heme/Allergies: Negative.   Psychiatric/Behavioral: Positive for depression, suicidal ideas and substance abuse. Negative for hallucinations and memory loss. The patient is nervous/anxious. The patient does not have insomnia.     Blood pressure 141/98, pulse 80, temperature 98.2 F (36.8 C), temperature source Oral, resp. rate 16, height 5\' 5"  (1.651 m), weight 63.05 kg (139 lb), last menstrual period 03/03/2013.Body mass index is 23.13 kg/(m^2).  General Appearance: Casual and Disheveled  Eye Contact::  Good  Speech:  Clear and Coherent  Volume:   Normal  Mood:  Anxious and Depressed  Affect:  Flat  Thought Process:  Goal Directed and Intact  Orientation:  Full (Time, Place, and Person)  Thought Content:  WDL  Suicidal Thoughts:  Yes.  without intent/plan  Homicidal Thoughts:  No  Memory:  Immediate;   Good Recent;   Good Remote;   Fair  Judgement:  Impaired  Insight:  Fair  Psychomotor Activity:  Restlessness  Concentration:  Fair  Recall:  Fair  Akathisia:  No  Handed:  Right  AIMS (if indicated):     Assets:  Communication Skills Desire for Improvement Leisure Time Resilience  Sleep:  Number of Hours: 6.75   Current Medications: Current Facility-Administered Medications  Medication Dose Route Frequency Provider Last Rate Last Dose  . acetaminophen (TYLENOL) tablet 650 mg  650 mg Oral Q6H PRN Court Joy, PA-C      . alum & mag hydroxide-simeth (MAALOX/MYLANTA) 200-200-20 MG/5ML suspension 30 mL  30 mL Oral Q4H PRN Court Joy, PA-C      . citalopram (CELEXA) tablet 10 mg  10 mg Oral Daily Wonda Cerise, MD   10 mg at 04/06/13 0831  . hydrOXYzine (ATARAX/VISTARIL) tablet 25 mg  25 mg Oral Q6H PRN Fransisca Kaufmann, NP   25 mg at 04/06/13 1158  . magnesium hydroxide (MILK OF MAGNESIA) suspension 30 mL  30 mL Oral Daily PRN Court Joy, PA-C      . traZODone (DESYREL) tablet 50 mg  50 mg Oral QHS PRN,MR X 1 Court Joy, PA-C   50 mg at 04/05/13 2055    Lab Results:  Results for orders placed  during the hospital encounter of 04/04/13 (from the past 48 hour(s))  OCCULT BLOOD X 1 CARD TO LAB, STOOL     Status: None   Collection Time    04/05/13  6:16 PM      Result Value Range   Fecal Occult Bld NEGATIVE  NEGATIVE  RAPID HIV SCREEN ([facility])     Status: None   Collection Time    04/06/13  6:44 AM      Result Value Range   SUDS Rapid HIV Screen NON REACTIVE  NON REACTIVE   Comment: RESULT CALLED TO, READ BACK BY AND VERIFIED WITH:     CHRIS JUDGE,RN [id number] @ 0814 BY J SCOTTON    Physical Findings: AIMS:  Facial and Oral Movements Muscles of Facial Expression: None, normal Lips and Perioral Area: None, normal Jaw: None, normal Tongue: None, normal,Extremity Movements Upper (arms, wrists, hands, fingers): None, normal Lower (legs, knees, ankles, toes): None, normal, Trunk Movements Neck, shoulders, hips: None, normal, Overall Severity Severity of abnormal movements (highest score from questions above): None, normal Incapacitation due to abnormal movements: None, normal Patient's awareness of abnormal movements (rate only patient's report): No Awareness, Dental Status Current problems with teeth and/or dentures?: No Does patient usually wear dentures?: No  CIWA:  CIWA-Ar Total: 1 COWS:     Treatment Plan Summary: Daily contact with patient to assess and evaluate symptoms and progress in treatment Medication management  Plan: Continue crisis management and stabilization.  Medication management: Reviewed medication regimen with patient who reported no untoward effects.  Encouraged patient to attend groups and participate in group counseling sessions and activities.  Address health issues: Vitals reviewed and stable. HIV testing negative. Lab-work reviewed order Ferrous Sulfate 325 po bid to address issues of low hemoglobin. Stool sample for occult blood sent to lab. Order for Cipro 250 mg bid times three days to treat for possible UTI. Patient continues to deny symptoms but UA reveal presence of bacteria.  Discharge plan in progress.  Continue current treatment plan.   Medical Decision Making Problem Points:  Established problem, stable/improving (1) and Review of psycho-social stressors (1) Data Points:  Review of medication regiment & side effects (2)  I certify that inpatient services furnished can reasonably be expected to improve the patient's condition.   Lety Cullens NP-C 04/06/2013, 2:03 PM

## 2013-04-06 NOTE — Progress Notes (Signed)
Nutrition Note  Patient identified on the Malnutrition Screening Tool (MST) Report  Body mass index is 23.13 kg/(m^2). Patient meets criteria for normal based on current BMI.   Current diet order is regular. Labs and medications reviewed.   Pt reported that she has lost weight. Her usual body weight is about 160 lbs, and she has dropped to 139 lbs. She said that she is currently trying to gain weight to be at 145-150 lbs, which is where she is happy. She reports that she is eating well at [facility], but she wanted to know how she could gain weight. She would like to try ensure once per day.  Nutrition Diagnosis: Inadequate oral intake related to drug use as evidenced by weight loss of 21 lbs.  Intervention: 1. Ensure Complete po once daily, each supplement provides 350 kcal and 13 grams of protein. 2. Discussed healthy ways to add calories to her meals so that she can get to the weight in which she feels most comfortable.  Monitor: Po intake, weight  If further nutrition issues arise, please consult RD.   Brandi Giles RD, LDN

## 2013-04-06 NOTE — Progress Notes (Signed)
Psychoeducational Group Note  Psychoeducational Group Note  Date: 04/06/2013 Time:  04/06/2013  Group Topic/Focus:  Gratefulness:  The focus of this group is to help patients identify what two things they are most grateful for in their lives. What helps ground them and to center them on their work to their recovery.  Participation Level:  Active  Participation Quality:  Appropriate  Affect:  Appropriate  Cognitive:  Oriented  Insight:  Improving  Engagement in Group:  Engaged  Additional Comments:    Mikail Goostree A   

## 2013-04-07 DIAGNOSIS — F1410 Cocaine abuse, uncomplicated: Principal | ICD-10-CM

## 2013-04-07 NOTE — Progress Notes (Signed)
[facility] Group Notes:  (Nursing/MHT/Case Management/Adjunct)  Date:  04/06/2013 Time:  2000  Type of Therapy:  Psychoeducational Skills  Participation Level:  Active  Participation Quality:  Appropriate  Affect:  Appropriate  Cognitive:  Appropriate  Insight:  Appropriate  Engagement in Group:  Engaged  Modes of Intervention:  Education  Summary of Progress/Problems: The patient verbalized in group that she had a positive day. She stated that she had two separate family visits today. Her goal for tomorrow is to speak with her case Production designer, theatre/television/film.   Yogesh Cominsky S 04/07/2013, 1:11 AM

## 2013-04-07 NOTE — Progress Notes (Addendum)
[facility] MD Progress Note  04/07/2013 5:23 PM Brandi Giles  MRN:  [id number]   Subjective: "I'm all right." Patient goes on to say that she is waiting on a placement in a drug rehab facility and that [facility] is working on this for her. She notes that her bag is packed and she is waiting on a bed at [facility]  Diagnosis: cocaine abuse and Substance induced mood disorder  ADL's:  Intact  Sleep: Good  Appetite:  Fair  Suicidal Ideation:  "off and on"  Patient notes that these feelings come and go Homicidal Ideation:  denies AEB (as evidenced by): Patient appears superficially calm, seems to be edgy. Clearly anxious over relapsing, worried about time between hospitalization and rehab bed availability. Concerned that she will relapse if she doesn't go to rehab from the hospital.  Psychiatric Specialty Exam: Review of Systems  Constitutional: Negative.  Negative for fever, chills, weight loss, malaise/fatigue and diaphoresis.  HENT: Negative for congestion and sore throat.   Eyes: Negative for blurred vision, double vision and photophobia.  Respiratory: Negative for cough, shortness of breath and wheezing.   Cardiovascular: Negative for chest pain, palpitations and PND.  Gastrointestinal: Negative for heartburn, nausea, vomiting, abdominal pain, diarrhea and constipation.  Musculoskeletal: Negative for myalgias, joint pain and falls.  Neurological: Negative for dizziness, tingling, tremors, sensory change, speech change, focal weakness, seizures, loss of consciousness, weakness and headaches.  Endo/Heme/Allergies: Negative for polydipsia. Does not bruise/bleed easily.  Psychiatric/Behavioral: Positive for depression and suicidal ideas. Negative for hallucinations, memory loss and substance abuse. The patient is nervous/anxious. The patient does not have insomnia.     Blood pressure 133/90, pulse 79, temperature 98.2 F (36.8 C), temperature source Oral, resp. rate 18, height 5'  5" (1.651 m), weight 63.05 kg (139 lb), last menstrual period 03/03/2013.Body mass index is 23.13 kg/(m^2).  General Appearance: Casual  Eye Contact::  Good  Speech:  Clear and Coherent  Volume:  Normal  Mood:  Anxious and Depressed  Affect:  Constricted  Thought Process:  Goal Directed  Orientation:  Full (Time, Place, and Person)  Thought Content:  Rumination  Suicidal Thoughts:  Yes.  without intent/plan  Homicidal Thoughts:  No  Memory:  NA  Judgement:  Fair  Insight:  Present  Psychomotor Activity:  Normal  Concentration:  Fair  Recall:  Fair  Akathisia:  No  Handed:  Right  AIMS (if indicated):     Assets:  Physical Health  Sleep:  Number of Hours: 6.75   Current Medications: Current Facility-Administered Medications  Medication Dose Route Frequency Provider Last Rate Last Dose  . acetaminophen (TYLENOL) tablet 650 mg  650 mg Oral Q6H PRN Court Joy, PA-C      . alum & mag hydroxide-simeth (MAALOX/MYLANTA) 200-200-20 MG/5ML suspension 30 mL  30 mL Oral Q4H PRN Court Joy, PA-C      . ciprofloxacin (CIPRO) tablet 250 mg  250 mg Oral Custom Fransisca Kaufmann, NP   250 mg at 04/07/13 1051  . citalopram (CELEXA) tablet 10 mg  10 mg Oral Daily Wonda Cerise, MD   10 mg at 04/07/13 0806  . feeding supplement (ENSURE COMPLETE) liquid 237 mL  237 mL Oral Q24H Earna Coder, RD   237 mL at 04/06/13 1823  . ferrous sulfate tablet 325 mg  325 mg Oral BID WC Fransisca Kaufmann, NP   325 mg at 04/07/13 1651  . hydrOXYzine (ATARAX/VISTARIL) tablet 25 mg  25 mg Oral Q6H  PRN Fransisca Kaufmann, NP   25 mg at 04/07/13 1052  . magnesium hydroxide (MILK OF MAGNESIA) suspension 30 mL  30 mL Oral Daily PRN Court Joy, PA-C      . traZODone (DESYREL) tablet 50 mg  50 mg Oral QHS PRN,MR X 1 Court Joy, PA-C   50 mg at 04/06/13 2107    Lab Results:  Results for orders placed during the hospital encounter of 04/04/13 (from the past 48 hour(s))  OCCULT BLOOD X 1 CARD TO LAB, STOOL     Status: None    Collection Time    04/05/13  6:16 PM      Result Value Range   Fecal Occult Bld NEGATIVE  NEGATIVE  RAPID HIV SCREEN ([facility])     Status: None   Collection Time    04/06/13  6:44 AM      Result Value Range   SUDS Rapid HIV Screen NON REACTIVE  NON REACTIVE   Comment: RESULT CALLED TO, READ BACK BY AND VERIFIED WITH:     CHRIS JUDGE,RN [id number] @ 0814 BY J SCOTTON  HIV ANTIBODY (ROUTINE TESTING)     Status: None   Collection Time    04/06/13  6:44 AM      Result Value Range   HIV NON REACTIVE  NON REACTIVE    Physical Findings: AIMS: Facial and Oral Movements Muscles of Facial Expression: None, normal Lips and Perioral Area: None, normal Jaw: None, normal Tongue: None, normal,Extremity Movements Upper (arms, wrists, hands, fingers): None, normal Lower (legs, knees, ankles, toes): None, normal, Trunk Movements Neck, shoulders, hips: None, normal, Overall Severity Severity of abnormal movements (highest score from questions above): None, normal Incapacitation due to abnormal movements: None, normal Patient's awareness of abnormal movements (rate only patient's report): No Awareness, Dental Status Current problems with teeth and/or dentures?: No Does patient usually wear dentures?: No  CIWA:  CIWA-Ar Total: 1 COWS:     Treatment Plan Summary: Daily contact with patient to assess and evaluate symptoms and progress in treatment Medication management  Plan:  Medical Decision Making Problem Points:  Established problem, stable/improving (1) and Review of psycho-social stressors (1) Data Points:  Review of medication regiment & side effects (2)  I certify that inpatient services furnished can reasonably be expected to improve the patient's condition.   MASHBURN,NEIL 04/07/2013, 5:23 PM  Reviewed the information documented and agree with the treatment plan.  Nakema Fake,JANARDHAHA R. 04/08/2013 4:56 PM

## 2013-04-07 NOTE — Progress Notes (Signed)
Adult Psychoeducational Group Note  Date:  04/07/2013 Time:  8:00PM Group Topic/Focus:  Wrap-Up Group:   The focus of this group is to help patients review their daily goal of treatment and discuss progress on daily workbooks.  Participation Level:  Active  Participation Quality:  Appropriate and Attentive  Affect:  Appropriate  Cognitive:  Alert and Appropriate  Insight: Appropriate  Engagement in Group:  Engaged  Modes of Intervention:  Discussion  Additional Comments:  Pt. Was attentive and appropriate during tonight's group discussion. Pt stated that she had a good day. Pt stated that he family was able to make her smile. Pt. Stated that she has learned a lot since being here. Pt stated that she has a lot to live for. Pt stated she achieved her goal of staying positive.   Bing Plume D 04/07/2013, 9:41 PM

## 2013-04-07 NOTE — Tx Team (Signed)
Interdisciplinary Treatment Plan Update (Adult)  Date: 04/07/2013  Time Reviewed:  9:45 AM  Progress in Treatment: Attending groups: Yes Participating in groups:  Yes Taking medication as prescribed:  Yes Tolerating medication:  Yes Family/Significant othe contact made: CSW assessing  Patient understands diagnosis:  Yes Discussing patient identified problems/goals with staff:  Yes Medical problems stabilized or resolved:  Yes Denies suicidal/homicidal ideation: Yes Issues/concerns per patient self-inventory:  Yes Other:  New problem(s) identified: N/A  Discharge Plan or Barriers: CSW assessing for appropriate referrals.  Reason for Continuation of Hospitalization: Anxiety Depression Medication Stabilization  Comments: N/A  Estimated length of stay: 3-5 days  For review of initial/current patient goals, please see plan of care.  Attendees: Patient:     Family:     Physician:  Dr. Johnalagadda 04/07/2013 11:24 AM   Nursing:   Beverly Knight, RN 04/07/2013 11:24 AM   Clinical Social Worker:  Eddy Liszewski Horton, LCSWA 04/07/2013 11:24 AM   Other:Andrea Thorne, RN  04/07/2013 11:24 AM   Other:  Maseta Dorley, MA care coordination 04/07/2013 11:24 AM   Other:   04/07/2013 11:24 AM   Other:     Other:    Other:    Other:    Other:    Other:    Other:     Scribe for Treatment Team:   Horton, Mella Inclan Nicole, 04/07/2013 11:24 AM   

## 2013-04-07 NOTE — Progress Notes (Signed)
Patient has been isolative to her room but attended group this evening. Patient reports that her day was better and she was glad to see her daughter and her fiance who visited this evening. Patient reports that her goal is to speak with her case manager and she is interested in long term tx once discharged. She reports that she know this is what she needs to do. Writer offered patient support and encouragement, she currently denies si/hi/a/v hallucinations. Safety maintained on unit, will continue to monitor.

## 2013-04-07 NOTE — Progress Notes (Signed)
D:  Per pt self inventory pt reports sleeping well, appetite good, energy level normal, ability to pay attention improving, rates depression at a 7 out of 10 and hopelessness at a 1 out of 10, denies SI/HI/AVH.      A:  Emotional support provided, Encouraged pt to continue with treatment plan and attend all group activities, q15 min checks maintained for safety.  R:  Pt is calm and brightens on approach, has been attending groups and is pleasant with staff and peers, interactive in the milieu.

## 2013-04-07 NOTE — [facility] Group Notes (Signed)
[facility] LCSW Aftercare Discharge Planning Group Note  04/07/2013 8:45 AM   Participation Quality: Alert and Appropriate   Mood/Affect: Appropriate  Depression Rating: 2  Anxiety Rating: 0  Thoughts of Suicide: Pt denies SI/HI   Will you contract for safety? Yes   Current AVH: No   Plan for Discharge/Comments: Pt attended discharge planning group and actively participated in group. CSW provided pt with today's workbook. Pt states that she came to the hospital for help with substance abuse.  Pt is hopeful to go to long term treatment from here.  Pt states that a case worker at [facility] is working on getting her into a treatment center at [facility].  Pt states that she goes to [facility] for outpatient services.  CSW will assess for appropriate referrals.  Pt lives in [place] but does want to return home until further treatment. No further needs voiced by pt at this time.   Transportation Means: Pt has access to transportation   Supports: No supports mentioned at this time  Reyes Ivan, LCSWA  04/07/2013 9:35 AM

## 2013-04-07 NOTE — Progress Notes (Signed)
Pt observed in the dayroom watching TV and interacting with her peers.  On 1:1 conversation, pt reports she is doing well this evening and voices no complaints.  She did say she wanted her meds around 9:15 so it would not make her so drowsy in the morning.  Pt denies SI/HI/AV.  She denies any withdrawal symptoms.  Pt voices no needs or concerns at this time.  Support and encouragement offered.  Safety maintained with q15 minute checks.

## 2013-04-07 NOTE — Progress Notes (Signed)
Recreation Therapy Notes  Date: 06.09.2014 Time: 3:0pm Location: [address] Dayroom       Group Topic/Focus: Decision Making, Communciation  Participation Level: Active  Participation Quality: Appropriate   Affect: Euthymic  Cognitive: Oriented   Additional Comments: Activity: List Boat Game ; Explanation: Patients given a scenario about taking a trip on a yacht, 1/2 way through their trip the yacht springs a leak and the patients must return to shore. They can save the group as well as 8 people off the following list: President Obama, Devona Konig, Physician, Female nurse, Curator, Electrician, Psychologist, occupational, Teacher, Pregnant Woman, Ex-Convict, Ex-Marine, [place], [place], [place], [place], [place]. Patients needed to work together to agree on the list of people they would like to save.   Patient actively participated in group activity. Patient gave sound reasoning and personal justification for the individuals she wanted to save. Patient debated appropriately with peers. Patient listened to wrap up discussion about the importance of a health support system to your wellness.   Marykay Lex Nysa Sarin, LRT/CTRS  Daveigh Batty L 04/07/2013 4:10 PM

## 2013-04-07 NOTE — Progress Notes (Signed)
Adult Psychoeducational Group Note  Date:  04/07/2013 Time:  11:20AM  Group Topic/Focus:  Self Care:   The focus of this group is to help patients understand the importance of self-care in order to improve or restore emotional, physical, spiritual, interpersonal, and financial health.  Participation Level:  Active  Participation Quality:  Appropriate  Affect:  Appropriate  Cognitive:  Appropriate  Insight: Appropriate  Engagement in Group:  Engaged  Modes of Intervention:  Discussion  Additional Comments:  Pt was active throughout group   Brandi Giles K 04/07/2013, 12:40 PM 

## 2013-04-07 NOTE — [facility] Group Notes (Signed)
[facility] LCSW Group Therapy  04/07/2013  1:15 PM   Type of Therapy:  Group Therapy  Participation Level:  Active  Participation Quality:  Appropriate and Attentive  Affect:  Appropriate  Cognitive:  Alert and Appropriate  Insight:  Developing/Improving and Engaged  Engagement in Therapy:  Developing/Improving and Engaged  Modes of Intervention:  Clarification, Confrontation, Discussion, Education, Exploration, Limit-setting, Orientation, Problem-solving, Rapport Building, Dance movement psychotherapist, Socialization and Support  Summary of Progress/Problems: Pt identified obstacles faced currently and processed barriers involved in overcoming these obstacles. Pt identified steps necessary for overcoming these obstacles and explored motivation (internal and external) for facing these difficulties head on. Pt further identified one area of concern in their lives and chose a goal to focus on for today.  Pt shared that her biggest obstacle is staying clean.  Pt was able to process her living environment and how it encourages her to use.  Pt states that she doesn't plan on changing her environment, even after treatment and is unsure how she will be able to stay clean.  Pt actively participated in group discussion.    Brandi Giles, LCSWA 04/07/2013 3:04 PM

## 2013-04-08 DIAGNOSIS — F1910 Other psychoactive substance abuse, uncomplicated: Secondary | ICD-10-CM

## 2013-04-08 DIAGNOSIS — F329 Major depressive disorder, single episode, unspecified: Secondary | ICD-10-CM

## 2013-04-08 MED ORDER — TRAZODONE HCL 50 MG PO TABS: 50.0000 mg | ORAL_TABLET | Freq: Every evening | ORAL | Status: DC | PRN

## 2013-04-08 MED ORDER — CIPROFLOXACIN HCL 250 MG PO TABS: 250.0000 mg | ORAL_TABLET | Freq: Two times a day (BID) | ORAL | Status: DC

## 2013-04-08 MED ORDER — FERROUS SULFATE 325 (65 FE) MG PO TABS: 325.0000 mg | ORAL_TABLET | Freq: Two times a day (BID) | ORAL | Status: DC

## 2013-04-08 MED ORDER — HYDROXYZINE HCL 25 MG PO TABS: 25.0000 mg | ORAL_TABLET | Freq: Four times a day (QID) | ORAL | Status: DC | PRN

## 2013-04-08 MED ORDER — CITALOPRAM HYDROBROMIDE 20 MG PO TABS
20.0000 mg | ORAL_TABLET | Freq: Every day | ORAL | Status: DC
  Filled 2013-04-08: qty 3

## 2013-04-08 MED ORDER — CITALOPRAM HYDROBROMIDE 10 MG PO TABS: 10.0000 mg | ORAL_TABLET | Freq: Every day | ORAL | Status: DC

## 2013-04-08 NOTE — [facility] Suicide Risk Assessment (Signed)
[facility] INPATIENT:  Family/Significant Other Suicide Prevention Education  Suicide Prevention Education:  Education Completed; Cristela Blue, [place], [phone];  has been identified by the patient as the family member/significant other with whom the patient will be residing, and identified as the person(s) who will aid the patient in the event of a mental health crisis (suicidal ideations/suicide attempt).  With written consent from the patient, the family member/significant other has been provided the following suicide prevention education, prior to the and/or following the discharge of the patient.  The suicide prevention education provided includes the following:  Suicide risk factors  Suicide prevention and interventions  National Suicide Hotline telephone number  [facility] assessment telephone number  [facility] Emergency Assistance 911  [facility] and/or Residential Mobile Crisis Unit telephone number  Request made of family/significant other to:  Remove weapons (e.g., guns, rifles, knives), all items previously/currently identified as safety concern.  Fiance reports patient does not have access to guns.  Remove drugs/medications (over-the-counter, prescriptions, illicit drugs), all items previously/currently identified as a safety concern.  The family member/significant other verbalizes understanding of the suicide prevention education information provided.  The family member/significant other agrees to remove the items of safety concern listed above.  Wynn Banker 04/08/2013, 9:50 AM

## 2013-04-08 NOTE — Plan of Care (Signed)
Problem: Alteration in mood & ability to function due to Goal: LTG-Patient demonstrates decreased signs of withdrawal Goal not met: Pt admitted with withdrawal symptom of anxiety and a CIWA score of a 1. Pt to show decrease withdrawal symptoms prior to d/c. Brandi Giles, LCSWA 04/07/2013  3:37 PM  (Patient demonstrates decreased signs of withdrawal to the point the patient is safe to return home and continue treatment in an outpatient setting)  Outcome: Completed/Met Date Met:  04/08/13 Patient is not endorsing signs/symptoms of withdrawal. She plans to follow up with [facility] post d/c for a treatment bed as one was not available prior to discharge.  Horace Porteous Lamiah Marmol, LCSW'04/08/2013

## 2013-04-08 NOTE — [facility] Group Notes (Addendum)
[facility] LCSW Aftercare Discharge Planning Group Note   04/08/2013 10:03 AM  Participation Quality:  Appropriate  Mood/Affect:  Appropriate  Depression Rating:  1  Anxiety Rating:  1  Thoughts of Suicide:  No  Will you contract for safety?   NA  Current AVH:  No  Plan for Discharge/Comments:  Patient reports doing well today and states being back on medications has made a big difference.  She stated she wants to go into a residential treatment program and was trying to get into one before admission.  Patient states she has home, transportation, and access to medication.  Contact to be made with [facility] regarding bed availability.  Transportation Means: Patient has transportation.   Supports: Patient has a good support system.   Brandi Giles, Brandi Giles

## 2013-04-08 NOTE — Progress Notes (Signed)
Discharge Note:  Patient discharged home with husband.  Denied SI and HI.   Denied A/V hallucinations.  Denied pain.  Patient received all her belongings, helmet, cell phones, pocketbook, wallet, belt sneakers, clothing, prescriptions, medications.  Patient received suicide prevention information which was discussed with her, and stated she understood and had no questions.  Patient stated she appreciated all assistance received from [facility] staff.

## 2013-04-08 NOTE — Plan of Care (Signed)
Problem: Alteration in mood Goal: LTG-Pt's behavior demonstrates decreased signs of depression Goal not met: Pt presents with flat affect and depressed mood. Pt admitted with depression rating of 10. Pt to show decreased sign of depression and a rating of 3 or less before d/c. Brandi Giles, LCSWA 04/07/2013 3:36 PM   (Patient's behavior demonstrates decreased signs of depression to the point the patient is safe to return home and continue treatment in an outpatient setting)  Outcome: Completed/Met Date Met:  04/08/13 Patient has attended groups and been active in discussions. Her follow up is scheduled with [facility].  Brandi Porteous [name], LCSW 04/08/2013

## 2013-04-08 NOTE — Plan of Care (Signed)
Problem: Ineffective individual coping Goal: STG: Patient will participate in after care plan Goal not met: CSW assessing for appropriate referrals for pt and will have follow up secured prior to d/c. Reyes Ivan, [place] 04/07/2013  3:36 PM  Outcome: Completed/Met Date Met:  04/08/13 Patient attended groups and was active in discussions.  Her follow up is scheduled with [facility].  Horace Porteous Doak Mah, LCSW 04/08/2013

## 2013-04-08 NOTE — Progress Notes (Signed)
Recreation Therapy Notes  Date: 06.10.2014 Time: 2:45pm Location: 500 [address] Dayroom      Group Topic/Focus: Musician (AAA/T)  Participation Level: Active  Participation Quality: Appropriate  Affect: Euthymic  Cognitive: Appropriate  Additional Comments: 06.10.2014 Session = AAA Session; Dog Team = [facility] & handler  Patient pet and visited with [facility] [place] [facility]. Patient asked appropriate questions about [facility] and his training. Patient shared personal stories about animals she has had in the past. Patient interacted appropriately with peers, LRT & MHT.   Marykay Lex Mariann Palo, LRT/CTRS  Jearl Klinefelter 04/08/2013 4:26 PM

## 2013-04-08 NOTE — Discharge Summary (Signed)
Physician Discharge Summary Note  Patient:  Brandi Giles is an 47 y.o., female MRN:  [id number] DOB:  December 09, 1965 Patient phone:  [phone] (home)  Patient address:   [address] [place] [address],   Date of Admission:  04/04/2013 Date of Discharge: 04/08/13  Reason for Admission:  Depression with SI, Cocaine Abuse  Discharge Diagnoses: Active Problems:   Cocaine abuse with cocaine-induced mood disorder  Review of Systems  Constitutional: Negative.   HENT: Negative.   Eyes: Negative.   Respiratory: Negative.   Cardiovascular: Negative.   Gastrointestinal: Negative.   Genitourinary: Negative.   Musculoskeletal: Negative.   Skin: Negative.   Neurological: Negative.   Endo/Heme/Allergies: Negative.   Psychiatric/Behavioral: Positive for substance abuse. Negative for depression, suicidal ideas, hallucinations and memory loss. The patient is nervous/anxious. The patient does not have insomnia.    Axis Diagnosis:   AXIS I:  Depressive Disorder NOS, Substance Abuse and Substance Induced Mood Disorder AXIS II:  Deferred AXIS III:   Past Medical History  Diagnosis Date  . Hypertension   . Hepatitis C   . Drug abuse and dependence   . Iron deficiency anemia    AXIS IV:  economic problems, other psychosocial or environmental problems, problems related to social environment and problems with primary support group AXIS V:  61-70 mild symptoms  Level of Care:  OP  Hospital Course:  Brandi Giles is an 47 y.o. female who reported to [facility] ER after a four day crack/cocaine binge. She has tried to stop on her own to no avail. She recently presented to the ER for help and was given referral to [facility]. Her appointment is not until July. Patient reports feeling more depressed during her recent binge and feels if she goes home she will kill herself by overdosing on pills as she no longer wants to live this way. She has been using for twenty years with no significant  dates of sobriety.       The duration of stay was four days. The patient was seen and evaluated by the Treatment team consisting of Psychiatrist, NP-C, RN, Case Manager, and Therapist for evaluation and treatment plan with goal of stabilization upon discharge. The patient's physical and mental health problems were identified and treated appropriately. Brandi Giles was tested for HIV during her stay, which was non-reactive. Her UA revealed the presence of many bacteria and Cipro 250 mg bid times three days as ordered to treat this problem.       Multiple modalities of treatment were used including medication, individual and group therapies, unit programming, improved nutrition, physical activity, and family sessions as needed. The patient was not taking any prescribed medication prior to her admission to [facility]. Patient was started on Celexa 10 mg to target symptoms of depression, Vistaril 25 mg po every six hours as needed for anxiety, and Trazodone 50 mg at hs to improve quality of sleep. She reported vistaril was very helpful in decreasing her symptoms so much that she requested samples upon discharge.      The symptoms of depression were monitored daily by evaluation by clinical provider.  The patient's mental and emotional status was evaluated by a daily self inventory completed by the patient. Improvement was demonstrated by declining numbers on the self assessment, improving vital signs, increased cognition, and improvement in mood, sleep, appetite as well as a reduction in physical symptoms.      The patient was evaluated and found to be stable enough for discharge  and was released to home per the initial plan of treatment. On the day of discharge patient rated her depression and anxiety at one. She will be following up with [facility] for continued treatment of her substance abuse problems. Denied SI to both NP and MD on the day of discharge. Patient did express some concern over relapsing and is anxious about this  possibility. Patient was given prescriptions for her new medications along with sample medications.   Mental Status Exam:  For mental status exam please see mental status exam and  suicide risk assessment completed by attending physician prior to discharge.  Consults:  None  Significant Diagnostic Studies:  labs: Chem profile, CBC, UA, UDS positive for cocaine, HIV negative  Discharge Vitals:   Blood pressure 137/94, pulse 78, temperature 98.6 F (37 C), temperature source Oral, resp. rate 16, height 5\' 5"  (1.651 m), weight 63.05 kg (139 lb), last menstrual period 03/03/2013. Body mass index is 23.13 kg/(m^2). Lab Results:   Results for orders placed during the hospital encounter of 04/04/13 (from the past 72 hour(s))  OCCULT BLOOD X 1 CARD TO LAB, STOOL     Status: None   Collection Time    04/05/13  6:16 PM      Result Value Range   Fecal Occult Bld NEGATIVE  NEGATIVE  RAPID HIV SCREEN ([facility])     Status: None   Collection Time    04/06/13  6:44 AM      Result Value Range   SUDS Rapid HIV Screen NON REACTIVE  NON REACTIVE   Comment: RESULT CALLED TO, READ BACK BY AND VERIFIED WITH:     CHRIS JUDGE,RN [id number] @ 0814 BY J SCOTTON  HIV ANTIBODY (ROUTINE TESTING)     Status: None   Collection Time    04/06/13  6:44 AM      Result Value Range   HIV NON REACTIVE  NON REACTIVE    Physical Findings: AIMS: Facial and Oral Movements Muscles of Facial Expression: None, normal Lips and Perioral Area: None, normal Jaw: None, normal Tongue: None, normal,Extremity Movements Upper (arms, wrists, hands, fingers): None, normal Lower (legs, knees, ankles, toes): None, normal, Trunk Movements Neck, shoulders, hips: None, normal, Overall Severity Severity of abnormal movements (highest score from questions above): None, normal Incapacitation due to abnormal movements: None, normal Patient's awareness of abnormal movements (rate only patient's report): No Awareness, Dental Status Current  problems with teeth and/or dentures?: No Does patient usually wear dentures?: No  CIWA:  CIWA-Ar Total: 1 COWS:     Psychiatric Specialty Exam: See Psychiatric Specialty Exam and Suicide Risk Assessment completed by Attending Physician prior to discharge.  Discharge destination:  Home  Is patient on multiple antipsychotic therapies at discharge:  No   Has Patient had three or more failed trials of antipsychotic monotherapy by history:  No  Recommended Plan for Multiple Antipsychotic Therapies: N/A  Discharge Orders   Future Orders Complete By Expires     Activity as tolerated - No restrictions  As directed     Diet - low sodium heart healthy  As directed         Medication List    TAKE these medications     Indication   ciprofloxacin 250 MG tablet  Commonly known as:  CIPRO  Take 1 tablet (250 mg total) by mouth 2 (two) times daily. Take one tablet two times daily until finished for urinary tract infection.   Indication:  Bladder Inflammation  citalopram 10 MG tablet  Commonly known as:  CELEXA  Take 1 tablet (10 mg total) by mouth daily. For depression.   Indication:  Depression     ferrous sulfate 325 (65 FE) MG tablet  Take 1 tablet (325 mg total) by mouth 2 (two) times daily with a meal. For low blood count.   Indication:  Iron Deficiency Anemia Patients w/Chronic Kidney Disease     hydrOXYzine 25 MG tablet  Commonly known as:  ATARAX/VISTARIL  Take 1 tablet (25 mg total) by mouth every 6 (six) hours as needed for anxiety.      traZODone 50 MG tablet  Commonly known as:  DESYREL  Take 1 tablet (50 mg total) by mouth at bedtime as needed and may repeat dose one time if needed for sleep.   Indication:  Trouble Sleeping, Major Depressive Disorder           Follow-up Information   Follow up with [facility].   Contact information:   [address] [place], [place]  [address]  [phone]      Follow up with [facility] On 04/09/2013. (Please call [facility] between 6:30- 7:00  AM to see if they have a treatment bed available)    Contact information:   [address] [place], [place]   [address]  [phone]      Follow-up recommendations:  Activity:  Resume usual activities.  Diet:  Regular  Comments:   Take all your medications as prescribed by your mental healthcare provider.  Report any adverse effects and or reactions from your medicines to your outpatient provider promptly.  Patient is instructed and cautioned to not engage in alcohol and or illegal drug use while on prescription medicines.  In the event of worsening symptoms, patient is instructed to call the crisis hotline, 911 and or go to the nearest ED for appropriate evaluation and treatment of symptoms.  Follow-up with your primary care provider for your other medical issues, concerns and or health care needs.   Total Discharge Time:  Greater than 30 minutes.  SignedFransisca Kaufmann NP-C 04/08/2013, 10:14 AM  Psychiatric face-to-face interview and exam for evaluation and management confirm these findings, diagnoses, and treatment plans. Medically certify need for inpatient stay and likely benefit to the patient.Reviewed the information documented and agree with the disposition plan.  Sarah Zerby,JANARDHAHA R. 04/08/2013 5:11 PM

## 2013-04-08 NOTE — Plan of Care (Signed)
Problem: Alteration in mood; excessive anxiety as evidenced by: Goal: LTG-Patient's behavior demonstrates decreased anxiety Goal not met: Pt admitted with anxiety rating of 10. Pt to show decreased sign of anxiety and a rating of 3 or less before d/c. Brandi Giles, LCSWA 04/07/2013 3:36 PM   (Patient's behavior demonstrates anxiety and he/she is utilizing learned coping skills to deal with anxiety-producing situations)  Outcome: Completed/Met Date Met:  04/08/13 Patient is rating anxiety at one.  She has attending groups, been active in discussions and follow up scheduled with [facility].  Brandi Giles [name], LCSW 04/08/2013

## 2013-04-08 NOTE — Progress Notes (Signed)
[facility] Adult Case Management Discharge Plan :  Will you be returning to the same living situation after discharge: Yes,  Patient is returning to her home. At discharge, do you have transportation home?:Yes,  Fiance to transport patient home. Do you have the ability to pay for your medications:  Yes, patient has Medicaid.  Release of information consent forms completed and in the chart;  Patient's signature needed at discharge.  Patient to Follow up at: Follow-up Information   Follow up with [facility] On 04/10/2013. (Thursday, April 10, 2013 at 7:45 AM.  Referral # [id number])    Contact information:   [address] [place], [place]  [address]  [phone]      Follow up with [facility] On 04/09/2013. (Please call [facility] between 6:30- 7:00 AM to see if they have a treatment bed available)    Contact information:   [address] [place], [place]   [address]  [phone]      Patient denies SI/HI:   Patient no longer endorsing SI/HI or other thoughts of self harm.  Safety Planning and Suicide Prevention discussed: .Reviewed with all patients during discharge planning group  Garland Hincapie, Joesph July 04/08/2013, 11:20 AM

## 2013-04-08 NOTE — [facility] Group Notes (Signed)
[facility] LCSW Group Therapy      Feelings About Diagnosis 1:15 - 2:30 PM         04/08/2013  3:24 PM    Type of Therapy:  Group Therapy  Participation Level:  Active  Participation Quality:  Appropriate  Affect:  Appropriate  Cognitive:  Alert and Appropriate  Insight:  Developing/Improving and Engaged  Engagement in Therapy:  Developing/Improving and Engaged  Modes of Intervention:  Discussion, Education, Exploration, Problem-Solving, Rapport Building, Support  Summary of Progress/Problems:  Patient actively participated in group.  She shared her diagnosis does not define who she is and is only a small part of her life.   Patient also talked about stigma family and society are judgmental and the stigma associated with mental health diagnosis,   Wynn Banker 04/08/2013  3:24 PM

## 2013-04-08 NOTE — Clinical Social Work Note (Signed)
Writer contacted [facility] and was advised they do not have any treatment beds today.  Both patient and fiance were advised to call [facility] each morning around 6:30 - 7:00 AM to check on bed availability as the earlier the call is made, the more likely they are to have an opening.  Patient provided with contact number on discharge instructions.

## 2013-04-08 NOTE — [facility] Suicide Risk Assessment (Signed)
Suicide Risk Assessment  Discharge Assessment     Demographic Factors:  Adolescent or young adult, Low socioeconomic status, Living alone and Unemployed  Mental Status Per Nursing Assessment::   On Admission:  Suicidal ideation indicated by patient;Self-harm thoughts  Current Mental Status by Physician: Mental Status Examination: Patient appeared as per his stated age, casually dressed, and fairly groomed, and maintaining good eye contact. Patient has good mood and his affect was constricted. He has normal rate, rhythm, and volume of speech. His thought process is linear and goal directed. Patient has denied suicidal, homicidal ideations, intentions or plans. Patient has no evidence of auditory or visual hallucinations, delusions, and paranoia. Patient has fair insight judgment and impulse control.  Loss Factors: Decline in physical health and Financial problems/change in socioeconomic status  Historical Factors: Impulsivity  Risk Reduction Factors:   Sense of responsibility to family, Religious beliefs about death, Living with another person, especially a relative, Positive social support, Positive therapeutic relationship and Positive coping skills or problem solving skills  Continued Clinical Symptoms:  Depression:   Comorbid alcohol abuse/dependence Impulsivity Recent sense of peace/wellbeing Severe Alcohol/Substance Abuse/Dependencies Previous Psychiatric Diagnoses and Treatments  Cognitive Features That Contribute To Risk:  Polarized thinking    Suicide Risk:  Minimal: No identifiable suicidal ideation.  Patients presenting with no risk factors but with morbid ruminations; may be classified as minimal risk based on the severity of the depressive symptoms  Discharge Diagnoses:   AXIS I:  Depressive Disorder NOS, Substance Abuse and Substance Induced Mood Disorder AXIS II:  Deferred AXIS III:   Past Medical History  Diagnosis Date  . Hypertension   . Hepatitis C   .  Drug abuse and dependence   . Iron deficiency anemia    AXIS IV:  economic problems, other psychosocial or environmental problems, problems related to social environment and problems with primary support group AXIS V:  61-70 mild symptoms  Plan Of Care/Follow-up recommendations:  Activity:  As tolerated Diet:  Regular  Is patient on multiple antipsychotic therapies at discharge:  No   Has Patient had three or more failed trials of antipsychotic monotherapy by history:  No  Recommended Plan for Multiple Antipsychotic Therapies: Not applicable  Namita Yearwood,JANARDHAHA R. 04/08/2013, 10:25 AM

## 2013-04-08 NOTE — Progress Notes (Signed)
Adult Psychoeducational Group Note  Date:  04/08/2013 Time:  11:41 AM  Group Topic/Focus:  Recovery Goals:   The focus of this group is to identify appropriate goals for recovery and establish a plan to achieve them.  Participation Level:  Active  Participation Quality:  Attentive  Affect:  Appropriate  Cognitive:  Appropriate  Insight: Appropriate  Engagement in Group:  Engaged  Modes of Intervention:  Discussion and Education  Additional Comments:  Staff explained to the patient that the purpose of this group is to educate them on recovery, and on setting mid-range to long-term goals for their recovery process. Kea stated her short-term goal was to go to rehab.  Tora Perches N 04/08/2013, 11:41 AM

## 2013-04-08 NOTE — Progress Notes (Signed)
Grief and Loss Group  Group members processed significant losses in their lives and the feelings relating to grief and loss. Group members picked a picture card that best described their grief and loss experience as a way of eliciting their responses.   Pt had recently just heard frustrating news about not being able to be placed in a rehab facility of her choice after leaving [facility]. The pt stated that she did not feel like she would be able to stay sober and cope on her own in the world. Pt expressed appreciation for the support that other group members had offered her in finding out about this experience right before group and indicated that it was difficult for her to find this level of support outside of this environment.  Lenoard Aden  Counselor Intern  Haroldine Laws

## 2013-04-11 NOTE — Progress Notes (Signed)
Patient Discharge Instructions:  After Visit Summary (AVS):   Faxed to:  04/11/13 Discharge Summary Note:   Faxed to:  04/11/13 Psychiatric Admission Assessment Note:   Faxed to:  04/11/13 Suicide Risk Assessment - Discharge Assessment:   Faxed to:  04/11/13 Faxed/Sent to the Next Level Care provider:  04/11/13 Faxed to [facility] @ [phone] Faxed to [facility] @ [phone]  Jerelene Redden, 04/11/2013, 1:48 PM

## 2013-10-24 ENCOUNTER — Encounter: Payer: Self-pay | Admitting: Internal Medicine

## 2013-10-24 ENCOUNTER — Ambulatory Visit: Payer: MEDICAID | Admitting: Internal Medicine

## 2013-11-20 ENCOUNTER — Encounter: Payer: Self-pay | Admitting: Cardiology

## 2013-11-20 ENCOUNTER — Ambulatory Visit (INDEPENDENT_AMBULATORY_CARE_PROVIDER_SITE_OTHER): Payer: Medicaid Other | Admitting: Cardiology

## 2013-11-20 VITALS — BP 107/64 | HR 69 | Ht 65.0 in | Wt 215.0 lb

## 2013-11-20 DIAGNOSIS — I10 Essential (primary) hypertension: Secondary | ICD-10-CM | POA: Insufficient documentation

## 2013-11-20 DIAGNOSIS — R011 Cardiac murmur, unspecified: Secondary | ICD-10-CM

## 2013-11-20 NOTE — Progress Notes (Signed)
Clinical Summary Brandi Giles is a 48 y.o.female seen today as a new patient referred for heart murmur  1. Heart murmur - reports she was told she had a murmur 15 years ago. Denies every having an echo done - reports DOE she attributes to weight gain, reports DOE at 1/2 mile which she walks to her mailbox. Noted this over the last 3 months - no orthopnea, no LE edema, no PND. No chest pain, no palpitations - father MIs in his 21s.   2. OSA? - snores at night. Boyfriend has mentioned she struggles for air while sleeping. + Daytime somnolence.  3. HTN - checks bp once a week, typically around 180s/80s - last labs 09/2013 K 4.1 Cr 0.81 BUN 15  - compliant with medications   Past Medical History  Diagnosis Date  . Hypertension   . Hepatitis C   . Drug abuse and dependence   . Iron deficiency anemia   . Tachycardia   . Anxiety   . Murmur      No Known Allergies   Current Outpatient Prescriptions  Medication Sig Dispense Refill  . amLODipine (NORVASC) 5 MG tablet Take 5 mg by mouth daily.      . citalopram (CELEXA) 10 MG tablet Take 20 mg by mouth daily. For depression.      . enalapril (VASOTEC) 10 MG tablet Take 10 mg by mouth daily.      . ferrous sulfate 325 (65 FE) MG tablet Take 1 tablet (325 mg total) by mouth 2 (two) times daily with a meal. For low blood count.  60 tablet  0  . hydrochlorothiazide (MICROZIDE) 12.5 MG capsule Take 12.5 mg by mouth daily.      . hydrOXYzine (ATARAX/VISTARIL) 25 MG tablet Take 1 tablet (25 mg total) by mouth every 6 (six) hours as needed for anxiety.  30 tablet  0  . metoprolol (LOPRESSOR) 50 MG tablet Take 50 mg by mouth 2 (two) times daily.      . QUEtiapine (SEROQUEL) 200 MG tablet Take 200 mg by mouth at bedtime.      Marland Kitchen QUEtiapine (SEROQUEL) 25 MG tablet Take 25 mg by mouth every morning.      . traZODone (DESYREL) 50 MG tablet Take 1 tablet (50 mg total) by mouth at bedtime as needed and may repeat dose one time if needed for  sleep.  60 tablet  0   No current facility-administered medications for this visit.     Past Surgical History  Procedure Laterality Date  . Cesarean section       No Known Allergies    No family history on file.   Social History Brandi Giles reports that she has never smoked. She does not have any smokeless tobacco history on file. Brandi Giles reports that she drinks alcohol.   Review of Systems CONSTITUTIONAL: daytime somnolence HEENT: Eyes: No visual loss, blurred vision, double vision or yellow sclerae.No hearing loss, sneezing, congestion, runny nose or sore throat.  SKIN: No rash or itching.  CARDIOVASCULAR: per HPI RESPIRATORY: No shortness of breath, cough or sputum.  GASTROINTESTINAL: No anorexia, nausea, vomiting or diarrhea. No abdominal pain or blood.  GENITOURINARY: No burning on urination, no polyuria NEUROLOGICAL: No headache, dizziness, syncope, paralysis, ataxia, numbness or tingling in the extremities. No change in bowel or bladder control.  MUSCULOSKELETAL: No muscle, back pain, joint pain or stiffness.  LYMPHATICS: No enlarged nodes. No history of splenectomy.  PSYCHIATRIC: No history of depression or  anxiety.  ENDOCRINOLOGIC: No reports of sweating, cold or heat intolerance. No polyuria or polydipsia.  Marland Kitchen.   Physical Examination p 69 bp 107/64 Wt 215 lbs BMI 36 Gen: resting comfortably, no acute distress HEENT: no scleral icterus, pupils equal round and reactive, no palptable cervical adenopathy,  CV: RRR, 2/6 systolic murmur RUSB, no JVD, no carotid bruits Resp: Clear to auscultation bilaterally GI: abdomen is soft, non-tender, non-distended, normal bowel sounds, no hepatosplenomegaly MSK: extremities are warm, no edema.  Skin: warm, no rash Neuro:  no focal deficits Psych: appropriate affect   Diagnostic Studies  11/20/13 Clinic EKG Sinus rhythm   Assessment and Plan  1. Heart murmur - will obtain echo to further evaluate  2. OSA? -  several symptoms/signs of possible OSA, could be related to her difficult to control HTN - will refer for sleep study  3. HTN - fairly aggressive blood pressure given her age, have asked her to keep a bp log at home.  - pending control, consider workup for primary HTN (renin/aldo, TSH, renal artery US, etc). OSA could be playing a role. She denies significant EtoH use   Follow up 1 months     Antoine PocheJonathan F. Branch, M.D., F.A.C.C.

## 2013-11-20 NOTE — Patient Instructions (Addendum)
Your physician recommends that you schedule a follow-up appointment in: 1 month   Your physician has requested that you have an echocardiogram. Echocardiography is a painless test that uses sound waves to create images of your heart. It provides your doctor with information about the size and shape of your heart and how well your heart's chambers and valves are working. This procedure takes approximately one hour. There are no restrictions for this procedure.     Your physician has recommended that you have a sleep study. This test records several body functions during sleep, including: brain activity, eye movement, oxygen and carbon dioxide blood levels, heart rate and rhythm, breathing rate and rhythm, the flow of air through your mouth and nose, snoring, body muscle movements, and chest and belly movement.

## 2013-11-25 ENCOUNTER — Ambulatory Visit (HOSPITAL_COMMUNITY): Payer: Medicaid Other | Attending: Cardiology

## 2013-11-30 ENCOUNTER — Encounter: Payer: Self-pay | Admitting: Neurology

## 2013-11-30 ENCOUNTER — Ambulatory Visit: Payer: Medicaid Other | Attending: Cardiology | Admitting: Sleep Medicine

## 2013-11-30 DIAGNOSIS — G4733 Obstructive sleep apnea (adult) (pediatric): Secondary | ICD-10-CM | POA: Insufficient documentation

## 2013-11-30 DIAGNOSIS — I10 Essential (primary) hypertension: Secondary | ICD-10-CM

## 2013-12-06 NOTE — Procedures (Signed)
  [facility] Brandi Byard A. Gerilyn Pilgrimoonquah, MD     [email or web address]          LOCATION: SLEEP LAB FACILITY: [facility]  PHYSICIAN: Roverto Bodmer A. Gerilyn Giles, M.D.   DATE OF STUDY: 11/30/2013  NOCTURNAL POLYSOMNOGRAM   REFERRING PHYSICIAN: Dina RichJonathan Branch.  INDICATIONS: This is a 48 year old who complains of fatigue, snoring and hypersomnia.  MEDICATIONS:  Prior to Admission medications   Medication Sig Start Date End Date Taking? Authorizing Provider  amLODipine (NORVASC) 5 MG tablet Take 5 mg by mouth daily.    Historical Provider, MD  citalopram (CELEXA) 10 MG tablet Take 20 mg by mouth daily. For depression. 04/08/13   Fransisca KaufmannLaura Davis, NP  enalapril (VASOTEC) 10 MG tablet Take 10 mg by mouth daily.    Historical Provider, MD  ferrous sulfate 325 (65 FE) MG tablet Take 1 tablet (325 mg total) by mouth 2 (two) times daily with a meal. For low blood count. 04/08/13   Fransisca KaufmannLaura Davis, NP  hydrochlorothiazide (MICROZIDE) 12.5 MG capsule Take 12.5 mg by mouth daily.    Historical Provider, MD  hydrOXYzine (ATARAX/VISTARIL) 25 MG tablet Take 1 tablet (25 mg total) by mouth every 6 (six) hours as needed for anxiety. 04/08/13   Fransisca KaufmannLaura Davis, NP  metoprolol (LOPRESSOR) 50 MG tablet Take 50 mg by mouth 2 (two) times daily.    Historical Provider, MD  traZODone (DESYREL) 50 MG tablet Take 1 tablet (50 mg total) by mouth at bedtime as needed and may repeat dose one time if needed for sleep. 04/08/13   Fransisca KaufmannLaura Davis, NP      EPWORTH SLEEPINESS SCALE: 12.   BMI: 36.   ARCHITECTURAL SUMMARY: Total recording time was 403 minutes. Sleep efficiency 75 %. Sleep latency 79 minutes. REM latency 39 minutes. Stage NI 2.5 %, N2 43 % and N3 19 % and REM sleep 35 %.    RESPIRATORY DATA:  Baseline oxygen saturation is 98 %. The lowest saturation is 90 %. The diagnostic AHI is 5. The RDI is 5. The REM AHI is 12.  LIMB MOVEMENT SUMMARY: PLM index 0.   ELECTROCARDIOGRAM SUMMARY: Average heart rate is 77 with no significant    dysrhythmias observed.   IMPRESSION:  1. Mild obstructive sleep apnea syndrome not requiring positive pressure treatment. 2. Abnormal sleep architecture with early REM latency. This can be seen in REM rebound phenomenon particularly from withdrawal from stimulants and antidepressants. Its also can be seen in narcolepsy but this is a much less common condition.  Thanks for this referral.  Flint Hakeem A. Gerilyn Giles, M.D. Diplomat, Biomedical engineerAmerican Board of Sleep Medicine.

## 2013-12-10 ENCOUNTER — Encounter: Payer: Self-pay | Admitting: *Deleted

## 2013-12-29 ENCOUNTER — Encounter: Payer: Self-pay | Admitting: Cardiology

## 2013-12-29 NOTE — Progress Notes (Signed)
Clinical Summary [name] is a 48 y.o.female seen today for follow up of the following medical problems.  1. Heart murmur  - reports she was told she had a murmur 15 years ago. Denies every having an echo done  - reports DOE she attributes to weight gain, reports DOE at 1/2 mile which she walks to her mailbox. Noted this over the last 3 months  - no orthopnea, no LE edema, no PND. No chest pain, no palpitations  - father MIs in his 350s.   2. OSA?  - snores at night. Boyfriend has mentioned she struggles for air while sleeping. + Daytime somnolence.  - recent sleep study showed mild OSA not requiring treatment at this time 3. HTN  - checks bp once a week, typically around 180s/80s  - last labs 09/2013 K 4.1 Cr 0.81 BUN 15  - compliant with medications    Past Medical History  Diagnosis Date  . Hypertension   . Hepatitis C   . Drug abuse and dependence   . Iron deficiency anemia   . Tachycardia   . Anxiety   . Murmur      No Known Allergies   Current Outpatient Prescriptions  Medication Sig Dispense Refill  . amLODipine (NORVASC) 5 MG tablet Take 5 mg by mouth daily.      . citalopram (CELEXA) 10 MG tablet Take 20 mg by mouth daily. For depression.      . enalapril (VASOTEC) 10 MG tablet Take 10 mg by mouth daily.      . ferrous sulfate 325 (65 FE) MG tablet Take 1 tablet (325 mg total) by mouth 2 (two) times daily with a meal. For low blood count.  60 tablet  0  . hydrochlorothiazide (MICROZIDE) 12.5 MG capsule Take 12.5 mg by mouth daily.      . hydrOXYzine (ATARAX/VISTARIL) 25 MG tablet Take 1 tablet (25 mg total) by mouth every 6 (six) hours as needed for anxiety.  30 tablet  0  . metoprolol (LOPRESSOR) 50 MG tablet Take 50 mg by mouth 2 (two) times daily.      . traZODone (DESYREL) 50 MG tablet Take 1 tablet (50 mg total) by mouth at bedtime as needed and may repeat dose one time if needed for sleep.  60 tablet  0   No current facility-administered  medications for this visit.     Past Surgical History  Procedure Laterality Date  . Cesarean section       No Known Allergies    No family history on file.   Social History [name] reports that she has never smoked. She does not have any smokeless tobacco history on file. [name] reports that she drinks alcohol.   Review of Systems CONSTITUTIONAL: No weight loss, fever, chills, weakness or fatigue.  HEENT: Eyes: No visual loss, blurred vision, double vision or yellow sclerae.No hearing loss, sneezing, congestion, runny nose or sore throat.  SKIN: No rash or itching.  CARDIOVASCULAR:  RESPIRATORY: No shortness of breath, cough or sputum.  GASTROINTESTINAL: No anorexia, nausea, vomiting or diarrhea. No abdominal pain or blood.  GENITOURINARY: No burning on urination, no polyuria NEUROLOGICAL: No headache, dizziness, syncope, paralysis, ataxia, numbness or tingling in the extremities. No change in bowel or bladder control.  MUSCULOSKELETAL: No muscle, back pain, joint pain or stiffness.  LYMPHATICS: No enlarged nodes. No history of splenectomy.  PSYCHIATRIC: No history of depression or anxiety.  ENDOCRINOLOGIC: No reports of sweating, cold or  heat intolerance. No polyuria or polydipsia.  Marland Kitchen   Physical Examination There were no vitals filed for this visit. There were no vitals filed for this visit.  Gen: resting comfortably, no acute distress HEENT: no scleral icterus, pupils equal round and reactive, no palptable cervical adenopathy,  CV Resp: Clear to auscultation bilaterally GI: abdomen is soft, non-tender, non-distended, normal bowel sounds, no hepatosplenomegaly MSK: extremities are warm, no edema.  Skin: warm, no rash Neuro:  no focal deficits Psych: appropriate affect   Diagnostic Studies 11/20/13 Clinic EKG  Sinus rhythm  11/20/13 Echo ?   Assessment and Plan   1. Heart murmur  - will obtain echo to further evaluate  2. OSA?  - several  symptoms/signs of possible OSA, could be related to her difficult to control HTN  - will refer for sleep study  3. HTN  - fairly aggressive blood pressure given her age, have asked her to keep a bp log at home.  - pending control, consider workup for primary HTN (renin/aldo, TSH, renal artery [place], etc). OSA could be playing a role. She denies significant EtoH use      Antoine Poche, M.D., F.A.C.C.

## 2013-12-29 NOTE — Progress Notes (Signed)
Clinical Summary Ms. Crossett is a 48 y.o.female seen today for follow up of the following medical problems.   1. Heart murmur  - reports she was told she had a murmur 15 years ago. Denies every having an echo done  - reports DOE she attributes to weight gain, reports DOE at 1/2 mile which she walks to her mailbox. Noted this over the last 3 months  - no orthopnea, no LE edema, no PND. No chest pain, no palpitations  - father MIs in his 21s.   - last visit ordered an echo but has not been done 2. OSA?  - snores at night. Boyfriend has mentioned she struggles for air while sleeping. + Daytime somnolence.  - recent sleep study showed mild OSA not requiring treatment at this time   3. HTN  - checks bp once a week, typically around 180s/80s  - last labs 09/2013 K 4.1 Cr 0.81 BUN 15  - compliant with medications  Past Medical History  Diagnosis Date  . Hypertension   . Hepatitis C   . Drug abuse and dependence   . Iron deficiency anemia   . Tachycardia   . Anxiety   . Murmur      No Known Allergies   Current Outpatient Prescriptions  Medication Sig Dispense Refill  . amLODipine (NORVASC) 5 MG tablet Take 5 mg by mouth daily.      . citalopram (CELEXA) 10 MG tablet Take 20 mg by mouth daily. For depression.      . enalapril (VASOTEC) 10 MG tablet Take 10 mg by mouth daily.      . ferrous sulfate 325 (65 FE) MG tablet Take 1 tablet (325 mg total) by mouth 2 (two) times daily with a meal. For low blood count.  60 tablet  0  . hydrochlorothiazide (MICROZIDE) 12.5 MG capsule Take 12.5 mg by mouth daily.      . hydrOXYzine (ATARAX/VISTARIL) 25 MG tablet Take 1 tablet (25 mg total) by mouth every 6 (six) hours as needed for anxiety.  30 tablet  0  . metoprolol (LOPRESSOR) 50 MG tablet Take 50 mg by mouth 2 (two) times daily.      . traZODone (DESYREL) 50 MG tablet Take 1 tablet (50 mg total) by mouth at bedtime as needed and may repeat dose one time if needed for sleep.  60  tablet  0   No current facility-administered medications for this visit.     Past Surgical History  Procedure Laterality Date  . Cesarean section       No Known Allergies    No family history on file.   Social History Ms. Jedlicka reports that she has never smoked. She does not have any smokeless tobacco history on file. Ms. Knodel reports that she drinks alcohol.   Review of Systems CONSTITUTIONAL: No weight loss, fever, chills, weakness or fatigue.  HEENT: Eyes: No visual loss, blurred vision, double vision or yellow sclerae.No hearing loss, sneezing, congestion, runny nose or sore throat.  SKIN: No rash or itching.  CARDIOVASCULAR:  RESPIRATORY: No shortness of breath, cough or sputum.  GASTROINTESTINAL: No anorexia, nausea, vomiting or diarrhea. No abdominal pain or blood.  GENITOURINARY: No burning on urination, no polyuria NEUROLOGICAL: No headache, dizziness, syncope, paralysis, ataxia, numbness or tingling in the extremities. No change in bowel or bladder control.  MUSCULOSKELETAL: No muscle, back pain, joint pain or stiffness.  LYMPHATICS: No enlarged nodes. No history of splenectomy.  PSYCHIATRIC: No history  of depression or anxiety.  ENDOCRINOLOGIC: No reports of sweating, cold or heat intolerance. No polyuria or polydipsia.  Marland Kitchen.   Physical Examination There were no vitals filed for this visit. There were no vitals filed for this visit.  Gen: resting comfortably, no acute distress HEENT: no scleral icterus, pupils equal round and reactive, no palptable cervical adenopathy,  CV Resp: Clear to auscultation bilaterally GI: abdomen is soft, non-tender, non-distended, normal bowel sounds, no hepatosplenomegaly MSK: extremities are warm, no edema.  Skin: warm, no rash Neuro:  no focal deficits Psych: appropriate affect   Diagnostic Studies     Assessment and Plan        Antoine PocheJonathan F. Dusan Lipford, M.D., F.A.C.C.

## 2014-01-05 ENCOUNTER — Other Ambulatory Visit (HOSPITAL_COMMUNITY): Payer: Medicaid Other

## 2014-01-21 ENCOUNTER — Ambulatory Visit (HOSPITAL_COMMUNITY)
Admission: RE | Admit: 2014-01-21 | Discharge: 2014-01-21 | Disposition: A | Discharge status: Home / Self Care | Payer: Medicaid Other | Source: Ambulatory Visit | Attending: Cardiology | Admitting: Cardiology

## 2014-01-21 DIAGNOSIS — F1410 Cocaine abuse, uncomplicated: Secondary | ICD-10-CM | POA: Insufficient documentation

## 2014-01-21 DIAGNOSIS — I10 Essential (primary) hypertension: Secondary | ICD-10-CM | POA: Insufficient documentation

## 2014-01-21 DIAGNOSIS — Z6835 Body mass index (BMI) 35.0-35.9, adult: Secondary | ICD-10-CM | POA: Insufficient documentation

## 2014-01-21 DIAGNOSIS — R011 Cardiac murmur, unspecified: Secondary | ICD-10-CM | POA: Insufficient documentation

## 2014-01-21 DIAGNOSIS — R0609 Other forms of dyspnea: Secondary | ICD-10-CM | POA: Insufficient documentation

## 2014-01-21 DIAGNOSIS — R0989 Other specified symptoms and signs involving the circulatory and respiratory systems: Secondary | ICD-10-CM | POA: Insufficient documentation

## 2014-01-21 DIAGNOSIS — B1920 Unspecified viral hepatitis C without hepatic coma: Secondary | ICD-10-CM | POA: Insufficient documentation

## 2014-01-21 DIAGNOSIS — I359 Nonrheumatic aortic valve disorder, unspecified: Secondary | ICD-10-CM

## 2014-01-21 NOTE — Progress Notes (Signed)
*  PRELIMINARY RESULTS* Echocardiogram 2D Echocardiogram has been performed.  Brandi Giles 01/21/2014, 11:03 AM

## 2014-02-02 ENCOUNTER — Ambulatory Visit: Payer: Self-pay | Admitting: Cardiology

## 2014-02-16 ENCOUNTER — Encounter: Payer: Self-pay | Admitting: Cardiology

## 2014-02-16 ENCOUNTER — Encounter: Payer: Self-pay | Admitting: *Deleted

## 2014-02-16 NOTE — Progress Notes (Signed)
Clinical Summary [name] is a 48 y.o.female  1. Heart murmur  - reports she was told she had a murmur 15 years ago. Denies every having an echo done  - reports DOE she attributes to weight gain, reports DOE at 1/2 mile which she walks to her mailbox. Noted this over the last 3 months  - no orthopnea, no LE edema, no PND. No chest pain, no palpitations  - father MIs in his 5450s.   - since last visit completed an echo which showed mild AI,MR,TR.    2. OSA - recent sleep study showed mild OSA, not requiring CPAP at this time.  - snores at night. Boyfriend has mentioned she struggles for air while sleeping. + Daytime somnolence.   3. HTN  - checks bp once a week, typically around 180s/80s  - last labs 09/2013 K 4.1 Cr 0.81 BUN 15  - compliant with medications - negative sleep study 4. Diastolic dysfunction - 01/2014 echo showed LVEF 60-65%, grade II diastolic dysfunction with mod LVH   Past Medical History  Diagnosis Date  . Hypertension   . Hepatitis C   . Drug abuse and dependence   . Iron deficiency anemia   . Tachycardia   . Anxiety   . Murmur      No Known Allergies   Current Outpatient Prescriptions  Medication Sig Dispense Refill  . amLODipine (NORVASC) 5 MG tablet Take 5 mg by mouth daily.      . citalopram (CELEXA) 10 MG tablet Take 20 mg by mouth daily. For depression.      . enalapril (VASOTEC) 10 MG tablet Take 10 mg by mouth daily.      . ferrous sulfate 325 (65 FE) MG tablet Take 1 tablet (325 mg total) by mouth 2 (two) times daily with a meal. For low blood count.  60 tablet  0  . hydrochlorothiazide (MICROZIDE) 12.5 MG capsule Take 12.5 mg by mouth daily.      . hydrOXYzine (ATARAX/VISTARIL) 25 MG tablet Take 1 tablet (25 mg total) by mouth every 6 (six) hours as needed for anxiety.  30 tablet  0  . metoprolol (LOPRESSOR) 50 MG tablet Take 50 mg by mouth 2 (two) times daily.      . traZODone (DESYREL) 50 MG tablet Take 1 tablet (50 mg total) by  mouth at bedtime as needed and may repeat dose one time if needed for sleep.  60 tablet  0   No current facility-administered medications for this visit.     Past Surgical History  Procedure Laterality Date  . Cesarean section       No Known Allergies    No family history on file.   Social History [name] reports that she has never smoked. She does not have any smokeless tobacco history on file. [name] reports that she drinks alcohol.   Review of Systems CONSTITUTIONAL: No weight loss, fever, chills, weakness or fatigue.  HEENT: Eyes: No visual loss, blurred vision, double vision or yellow sclerae.No hearing loss, sneezing, congestion, runny nose or sore throat.  SKIN: No rash or itching.  CARDIOVASCULAR:  RESPIRATORY: No shortness of breath, cough or sputum.  GASTROINTESTINAL: No anorexia, nausea, vomiting or diarrhea. No abdominal pain or blood.  GENITOURINARY: No burning on urination, no polyuria NEUROLOGICAL: No headache, dizziness, syncope, paralysis, ataxia, numbness or tingling in the extremities. No change in bowel or bladder control.  MUSCULOSKELETAL: No muscle, back pain, joint pain or stiffness.  LYMPHATICS: No enlarged nodes. No history of splenectomy.  PSYCHIATRIC: No history of depression or anxiety.  ENDOCRINOLOGIC: No reports of sweating, cold or heat intolerance. No polyuria or polydipsia.  Marland Kitchen.   Physical Examination There were no vitals filed for this visit. There were no vitals filed for this visit.  Gen: resting comfortably, no acute distress HEENT: no scleral icterus, pupils equal round and reactive, no palptable cervical adenopathy,  CV Resp: Clear to auscultation bilaterally GI: abdomen is soft, non-tender, non-distended, normal bowel sounds, no hepatosplenomegaly MSK: extremities are warm, no edema.  Skin: warm, no rash Neuro:  no focal deficits Psych: appropriate affect   Diagnostic Studies 12/2013 Echo - Left ventricle: The  cavity size was normal. Wall thickness was increased in a pattern of moderate LVH. Systolic function was normal. The estimated ejection fraction was in the range of 60% to 65%. Wall motion was normal; there were no regional wall motion abnormalities. Features are consistent with a pseudonormal left ventricular filling pattern, with concomitant abnormal relaxation and increased filling pressure (grade 2 diastolic dysfunction). Doppler parameters are consistent with high ventricular filling pressure. - Aortic valve: Mild regurgitation. - Mitral valve: Mildly thickened leaflets . Mild regurgitation. Peak A-wave velocity: 78.2cm/s. - Left atrium: The atrium was mildly dilated. - Atrial septum: No defect or patent foramen ovale was identified. - Tricuspid valve: Mild regurgitation.      Assessment and Plan   1. Heart murmur  - will obtain echo to further evaluate   2. OSA?  - several symptoms/signs of possible OSA, could be related to her difficult to control HTN  - will refer for sleep study   3. HTN  - fairly aggressive blood pressure given her age, have asked her to keep a bp log at home.  - pending control, consider workup for primary HTN (renin/aldo, TSH, renal artery US, etc). OSA could be playing a role. She denies significant EtoH use      Antoine PocheJonathan F. Kmari Brian, M.D., F.A.C.C.

## 2014-02-26 ENCOUNTER — Ambulatory Visit (INDEPENDENT_AMBULATORY_CARE_PROVIDER_SITE_OTHER): Payer: Medicaid Other | Admitting: Cardiology

## 2014-02-26 VITALS — BP 160/92 | HR 86 | Ht 65.0 in | Wt 213.0 lb

## 2014-02-26 DIAGNOSIS — I10 Essential (primary) hypertension: Secondary | ICD-10-CM

## 2014-02-26 DIAGNOSIS — I519 Heart disease, unspecified: Secondary | ICD-10-CM

## 2014-02-26 DIAGNOSIS — R011 Cardiac murmur, unspecified: Secondary | ICD-10-CM

## 2014-02-26 DIAGNOSIS — I5189 Other ill-defined heart diseases: Secondary | ICD-10-CM

## 2014-02-26 MED ORDER — FERROUS SULFATE 325 (65 FE) MG PO TABS: 325.0000 mg | ORAL_TABLET | Freq: Two times a day (BID) | ORAL | Status: DC

## 2014-02-26 MED ORDER — ENALAPRIL MALEATE 10 MG PO TABS: 10.0000 mg | ORAL_TABLET | Freq: Every day | ORAL | Status: DC

## 2014-02-26 MED ORDER — AMLODIPINE BESYLATE 5 MG PO TABS: 5.0000 mg | ORAL_TABLET | Freq: Every day | ORAL | Status: DC

## 2014-02-26 MED ORDER — HYDROCHLOROTHIAZIDE 12.5 MG PO CAPS
12.5000 mg | ORAL_CAPSULE | Freq: Every day | ORAL | Status: DC
Start: 2014-02-26 — End: 2014-07-08

## 2014-02-26 MED ORDER — METOPROLOL TARTRATE 50 MG PO TABS: 50.0000 mg | ORAL_TABLET | Freq: Two times a day (BID) | ORAL | Status: DC

## 2014-02-26 NOTE — Progress Notes (Signed)
Clinical Summary Brandi Giles is a 48 y.o.female seen today for follow up of the following medical problems.   1. Heart murmur  - reports she was told she had a murmur 15 years ago. Denies every having an echo done  - reports DOE she attributes to weight gain, reports DOE at 1/2 mile which she walks to her mailbox. Noted this over the last 3 months  - no orthopnea, no LE edema, no PND. No chest pain, no palpitations    2. HTN  - checks bp once a week, typically around 180s/80 -reports she ran out of meds a few weeks   3. Diastolic dysfunction - echo 12/2013 with LVEF 60-65%, grade II diastolic dysfunction - stable DOE as described above  Past Medical History  Diagnosis Date  . Hypertension   . Hepatitis C   . Drug abuse and dependence   . Iron deficiency anemia   . Tachycardia   . Anxiety   . Murmur      No Known Allergies   Current Outpatient Prescriptions  Medication Sig Dispense Refill  . amLODipine (NORVASC) 5 MG tablet Take 5 mg by mouth daily.      . citalopram (CELEXA) 10 MG tablet Take 20 mg by mouth daily. For depression.      . enalapril (VASOTEC) 10 MG tablet Take 10 mg by mouth daily.      . ferrous sulfate 325 (65 FE) MG tablet Take 1 tablet (325 mg total) by mouth 2 (two) times daily with a meal. For low blood count.  60 tablet  0  . hydrochlorothiazide (MICROZIDE) 12.5 MG capsule Take 12.5 mg by mouth daily.      . hydrOXYzine (ATARAX/VISTARIL) 25 MG tablet Take 1 tablet (25 mg total) by mouth every 6 (six) hours as needed for anxiety.  30 tablet  0  . metoprolol (LOPRESSOR) 50 MG tablet Take 50 mg by mouth 2 (two) times daily.      . traZODone (DESYREL) 50 MG tablet Take 1 tablet (50 mg total) by mouth at bedtime as needed and may repeat dose one time if needed for sleep.  60 tablet  0   No current facility-administered medications for this visit.     Past Surgical History  Procedure Laterality Date  . Cesarean section       No Known  Allergies    No family history on file.   Social History Brandi Giles reports that she has never smoked. She does not have any smokeless tobacco history on file. Brandi Giles reports that she drinks alcohol.   Review of Systems CONSTITUTIONAL: No weight loss, fever, chills, weakness or fatigue.  HEENT: Eyes: No visual loss, blurred vision, double vision or yellow sclerae.No hearing loss, sneezing, congestion, runny nose or sore throat.  SKIN: No rash or itching.  CARDIOVASCULAR: per HPI RESPIRATORY: +SOB GASTROINTESTINAL: No anorexia, nausea, vomiting or diarrhea. No abdominal pain or blood.  GENITOURINARY: No burning on urination, no polyuria NEUROLOGICAL: No headache, dizziness, syncope, paralysis, ataxia, numbness or tingling in the extremities. No change in bowel or bladder control.  MUSCULOSKELETAL: No muscle, back pain, joint pain or stiffness.  LYMPHATICS: No enlarged nodes. No history of splenectomy.  PSYCHIATRIC: No history of depression or anxiety.  ENDOCRINOLOGIC: No reports of sweating, cold or heat intolerance. No polyuria or polydipsia.  Marland Kitchen.   Physical Examination p 86 bp 160/92 Wt 213 lbs BMI 35 Gen: resting comfortably, no acute distress HEENT: no scleral icterus, pupils  equal round and reactive, no palptable cervical adenopathy,  CV: RRR, 2/6 systolic murmur at apex.  Resp: Clear to auscultation bilaterally GI: abdomen is soft, non-tender, non-distended, normal bowel sounds, no hepatosplenomegaly MSK: extremities are warm, no edema.  Skin: warm, no rash Neuro:  no focal deficits Psych: appropriate affect   Diagnostic Studies  01/21/14 Echo Study Conclusions  - Left ventricle: The cavity size was normal. Wall thickness was increased in a pattern of moderate LVH. Systolic function was normal. The estimated ejection fraction was in the range of 60% to 65%. Wall motion was normal; there were no regional wall motion abnormalities. Features are consistent with  a pseudonormal left ventricular filling pattern, with concomitant abnormal relaxation and increased filling pressure (grade 2 diastolic dysfunction). Doppler parameters are consistent with high ventricular filling pressure. - Aortic valve: Mild regurgitation. - Mitral valve: Mildly thickened leaflets . Mild regurgitation. Peak A-wave velocity: 78.2cm/s. - Left atrium: The atrium was mildly dilated. - Atrial septum: No defect or patent foramen ovale was identified. - Tricuspid valve: Mild regurgitation.     Assessment and Plan   1. Heart murmur  - no significant valvular pathology of recent echo, mild MR, TR, and AI - continue to follow clinically   2. HTN  - fairly aggressive blood pressure given her age, have asked her to keep a bp log at home. She has been off her meds since she ran out of them, will refill meds and have her submit a bp log in 2 weeks. - pending control, consider workup for primary HTN (renin/aldo, TSH, renal artery US, etc). Recent sleep study showed only mild. She denies significant EtoH use  3. Diastolic dysfunction - secondary to her uncontrolled HTN. Appears to be euvolemic, work for better bp control.   F/u 4 months  Antoine PocheJonathan F. Branch, M.D., F.A.C.C.

## 2014-02-26 NOTE — Patient Instructions (Signed)
Your physician wants you to follow-up in:4 months You will receive a reminder letter in the mail two months in advance. If you don't receive a letter, please call our office to schedule the follow-up appointment.     Please keep BP log and record daily BP and return to office in 2 weeks

## 2014-03-23 DIAGNOSIS — I5189 Other ill-defined heart diseases: Secondary | ICD-10-CM | POA: Insufficient documentation

## 2014-03-30 NOTE — Progress Notes (Signed)
This encounter was created in error - please disregard.

## 2014-07-08 ENCOUNTER — Ambulatory Visit (INDEPENDENT_AMBULATORY_CARE_PROVIDER_SITE_OTHER): Payer: Medicaid Other | Admitting: Cardiology

## 2014-07-08 ENCOUNTER — Encounter: Payer: Self-pay | Admitting: Cardiology

## 2014-07-08 VITALS — BP 110/78 | HR 55 | Ht 65.0 in | Wt 197.0 lb

## 2014-07-08 DIAGNOSIS — I10 Essential (primary) hypertension: Secondary | ICD-10-CM

## 2014-07-08 DIAGNOSIS — I5032 Chronic diastolic (congestive) heart failure: Secondary | ICD-10-CM | POA: Insufficient documentation

## 2014-07-08 MED ORDER — HYDROCHLOROTHIAZIDE 25 MG PO TABS: 25.0000 mg | ORAL_TABLET | Freq: Every day | ORAL | Status: DC

## 2014-07-08 MED ORDER — AMLODIPINE BESYLATE 10 MG PO TABS: 10.0000 mg | ORAL_TABLET | Freq: Every day | ORAL | Status: DC

## 2014-07-08 NOTE — Patient Instructions (Addendum)
Your physician recommends that you schedule a follow-up appointment in: 4 months   Your physician has recommended you make the following change in your medication:     INCREASE HCTZ to 25 mg daily  INCREASE Norvasc to 10 mg daily   STOP Metoprolol   Please keep daily BP log and return in 2 weeks for MD to review readings      Thank you for choosing [facility] !

## 2014-07-08 NOTE — Progress Notes (Signed)
Clinical Summary Brandi Giles is a 48 y.o.female  1. Chronic diastolic heart failure - echo 2/130 with LVEF 60-65%, grade II diastolic dysfunction. - no orthopnea, no LE edema, no PND. No chest pain, no palpitations    2. HTN  - checks every morning before meds, typically around 180/85 - compliant with medications - completed sleep study earlier this AM, mild OSA not requiring CPAP  Past Medical History  Diagnosis Date  . Hypertension   . Hepatitis C   . Drug abuse and dependence   . Iron deficiency anemia   . Tachycardia   . Anxiety   . Murmur      No Known Allergies   Current Outpatient Prescriptions  Medication Sig Dispense Refill  . amLODipine (NORVASC) 5 MG tablet Take 1 tablet (5 mg total) by mouth daily.  90 tablet  3  . citalopram (CELEXA) 10 MG tablet Take 20 mg by mouth daily. For depression.      . enalapril (VASOTEC) 10 MG tablet Take 1 tablet (10 mg total) by mouth daily.  90 tablet  3  . ferrous sulfate 325 (65 FE) MG tablet Take 1 tablet (325 mg total) by mouth 2 (two) times daily with a meal. For low blood count.  60 tablet  6  . hydrochlorothiazide (MICROZIDE) 12.5 MG capsule Take 1 capsule (12.5 mg total) by mouth daily.  90 capsule  3  . hydrOXYzine (ATARAX/VISTARIL) 25 MG tablet Take 1 tablet (25 mg total) by mouth every 6 (six) hours as needed for anxiety.  30 tablet  0  . metoprolol (LOPRESSOR) 50 MG tablet Take 1 tablet (50 mg total) by mouth 2 (two) times daily.  180 tablet  3  . traZODone (DESYREL) 50 MG tablet Take 1 tablet (50 mg total) by mouth at bedtime as needed and may repeat dose one time if needed for sleep.  60 tablet  0   No current facility-administered medications for this visit.     Past Surgical History  Procedure Laterality Date  . Cesarean section       No Known Allergies    No family history on file.   Social History Brandi Giles reports that she has never smoked. She does not have any smokeless tobacco history  on file. Brandi Giles reports that she drinks alcohol.   Review of Systems CONSTITUTIONAL: No weight loss, fever, chills, weakness or fatigue.  HEENT: Eyes: No visual loss, blurred vision, double vision or yellow sclerae.No hearing loss, sneezing, congestion, runny nose or sore throat.  SKIN: No rash or itching.  CARDIOVASCULAR: per HPI RESPIRATORY: No shortness of breath, cough or sputum.  GASTROINTESTINAL: No anorexia, nausea, vomiting or diarrhea. No abdominal pain or blood.  GENITOURINARY: No burning on urination, no polyuria NEUROLOGICAL: No headache, dizziness, syncope, paralysis, ataxia, numbness or tingling in the extremities. No change in bowel or bladder control.  MUSCULOSKELETAL: No muscle, back pain, joint pain or stiffness.  LYMPHATICS: No enlarged nodes. No history of splenectomy.  PSYCHIATRIC: No history of depression or anxiety.  ENDOCRINOLOGIC: No reports of sweating, cold or heat intolerance. No polyuria or polydipsia.  Marland Kitchen   Physical Examination p 55 bp 110/78 Wt 197 lbs BMI 33 Gen: resting comfortably, no acute distress HEENT: no scleral icterus, pupils equal round and reactive, no palptable cervical adenopathy,  CV: RRR, no m/r/g, no JVD, no carotid bruits Resp: Clear to auscultation bilaterally GI: abdomen is soft, non-tender, non-distended, normal bowel sounds, no hepatosplenomegaly MSK: extremities are  warm, no edema.  Skin: warm, no rash Neuro:  no focal deficits Psych: appropriate affect   Diagnostic Studies 12/2013 echo Study Conclusions  - Left ventricle: The cavity size was normal. Wall thickness was increased in a pattern of moderate LVH. Systolic function was normal. The estimated ejection fraction was in the range of 60% to 65%. Wall motion was normal; there were no regional wall motion abnormalities. Features are consistent with a pseudonormal left ventricular filling pattern, with concomitant abnormal relaxation and increased filling pressure  (grade 2 diastolic dysfunction). Doppler parameters are consistent with high ventricular filling pressure. - Aortic valve: Mild regurgitation. - Mitral valve: Mildly thickened leaflets . Mild regurgitation. Peak A-wave velocity: 78.2cm/s. - Left atrium: The atrium was mildly dilated. - Atrial septum: No defect or patent foramen ovale was identified. - Tricuspid valve: Mild regurgitation.      Assessment and Plan  1. Chronic diastolic heart failure - appears euvolemic today, no current symptoms - continue bp control and diuretic   2. HTN  - bp at home elevated, at goal in clinic. I have asked her to start checking at home at least 1 hour after taking her meds - she is on 4 low dose bp meds, will work to consolidate her therapy. Increase norvasc and  and HCTZ to  daily, stop metoprolol - pending control, consider workup for primary HTN (renin/aldo, TSH, renal artery [place], etc).She denies significant EtoH use. Recent sleep study with only mild OSA not requiring CPAP  - she is to bring in bp log in 2 weeks, educated on how to check bp's at home  F/u 4 months Check routine labs today         Antoine Poche, M.D., F.A.C.C.

## 2014-11-22 ENCOUNTER — Encounter (HOSPITAL_COMMUNITY): Payer: Self-pay | Admitting: Emergency Medicine

## 2014-11-22 ENCOUNTER — Emergency Department (HOSPITAL_COMMUNITY): Payer: Medicaid Other

## 2014-11-22 ENCOUNTER — Emergency Department (HOSPITAL_COMMUNITY)
Admission: EM | Admit: 2014-11-22 | Discharge: 2014-11-22 | Disposition: A | Discharge status: Home / Self Care | Payer: Medicaid Other | Attending: Emergency Medicine | Admitting: Emergency Medicine

## 2014-11-22 DIAGNOSIS — J019 Acute sinusitis, unspecified: Secondary | ICD-10-CM | POA: Insufficient documentation

## 2014-11-22 DIAGNOSIS — I10 Essential (primary) hypertension: Secondary | ICD-10-CM | POA: Diagnosis not present

## 2014-11-22 DIAGNOSIS — J40 Bronchitis, not specified as acute or chronic: Secondary | ICD-10-CM

## 2014-11-22 DIAGNOSIS — D509 Iron deficiency anemia, unspecified: Secondary | ICD-10-CM | POA: Insufficient documentation

## 2014-11-22 DIAGNOSIS — R011 Cardiac murmur, unspecified: Secondary | ICD-10-CM | POA: Insufficient documentation

## 2014-11-22 DIAGNOSIS — R05 Cough: Secondary | ICD-10-CM

## 2014-11-22 DIAGNOSIS — J209 Acute bronchitis, unspecified: Secondary | ICD-10-CM | POA: Insufficient documentation

## 2014-11-22 DIAGNOSIS — Z8619 Personal history of other infectious and parasitic diseases: Secondary | ICD-10-CM | POA: Insufficient documentation

## 2014-11-22 DIAGNOSIS — R059 Cough, unspecified: Secondary | ICD-10-CM

## 2014-11-22 DIAGNOSIS — Z79899 Other long term (current) drug therapy: Secondary | ICD-10-CM | POA: Insufficient documentation

## 2014-11-22 DIAGNOSIS — F419 Anxiety disorder, unspecified: Secondary | ICD-10-CM | POA: Insufficient documentation

## 2014-11-22 DIAGNOSIS — J029 Acute pharyngitis, unspecified: Secondary | ICD-10-CM | POA: Diagnosis present

## 2014-11-22 HISTORY — DX: Endocarditis, valve unspecified: I38

## 2014-11-22 MED ORDER — HYDROCOD POLST-CHLORPHEN POLST 10-8 MG/5ML PO LQCR: 5.0000 mL | Freq: Two times a day (BID) | ORAL | Status: DC

## 2014-11-22 MED ORDER — AMOXICILLIN 500 MG PO CAPS: 500.0000 mg | ORAL_CAPSULE | Freq: Three times a day (TID) | ORAL | Status: DC

## 2014-11-22 MED ORDER — FLUTICASONE PROPIONATE 50 MCG/ACT NA SUSP: NASAL | Status: DC

## 2014-11-22 NOTE — Discharge Instructions (Signed)
Cough, Adult  A cough is a reflex that helps clear your throat and airways. It can help heal the body or may be a reaction to an irritated airway. A cough may only last 2 or 3 weeks (acute) or may last more than 8 weeks (chronic).  CAUSES Acute cough:  Viral or bacterial infections. Chronic cough:  Infections.  Allergies.  Asthma.  Post-nasal drip.  Smoking.  Heartburn or acid reflux.  Some medicines.  Chronic lung problems (COPD).  Cancer. SYMPTOMS   Cough.  Fever.  Chest pain.  Increased breathing rate.  High-pitched whistling sound when breathing (wheezing).  Colored mucus that you cough up (sputum). TREATMENT   A bacterial cough may be treated with antibiotic medicine.  A viral cough must run its course and will not respond to antibiotics.  Your caregiver may recommend other treatments if you have a chronic cough. HOME CARE INSTRUCTIONS   Only take over-the-counter or prescription medicines for pain, discomfort, or fever as directed by your caregiver. Use cough suppressants only as directed by your caregiver.  Use a cold steam vaporizer or humidifier in your bedroom or home to help loosen secretions.  Sleep in a semi-upright position if your cough is worse at night.  Rest as needed.  Stop smoking if you smoke. SEEK IMMEDIATE MEDICAL CARE IF:   You have pus in your sputum.  Your cough starts to worsen.  You cannot control your cough with suppressants and are losing sleep.  You begin coughing up blood.  You have difficulty breathing.  You develop pain which is getting worse or is uncontrolled with medicine.  You have a fever. MAKE SURE YOU:   Understand these instructions.  Will watch your condition.  Will get help right away if you are not doing well or get worse. Document Released: 04/14/2011 Document Revised: 01/08/2012 Document Reviewed: 04/14/2011 Exit[employer] Patient Information 2015 [employer]. This information is not intended  to replace advice given to you by your health care provider. Make sure you discuss any questions you have with your health care provider. Sinusitis Sinusitis is redness, soreness, and inflammation of the paranasal sinuses. Paranasal sinuses are air pockets within the bones of your face (beneath the eyes, the middle of the forehead, or above the eyes). In healthy paranasal sinuses, mucus is able to drain out, and air is able to circulate through them by way of your nose. However, when your paranasal sinuses are inflamed, mucus and air can become trapped. This can allow bacteria and other germs to grow and cause infection. Sinusitis can develop quickly and last only a short time (acute) or continue over a long period (chronic). Sinusitis that lasts for more than 12 weeks is considered chronic.  CAUSES  Causes of sinusitis include:  Allergies.  Structural abnormalities, such as displacement of the cartilage that separates your nostrils (deviated septum), which can decrease the air flow through your nose and sinuses and affect sinus drainage.  Functional abnormalities, such as when the small hairs (cilia) that line your sinuses and help remove mucus do not work properly or are not present. SIGNS AND SYMPTOMS  Symptoms of acute and chronic sinusitis are the same. The primary symptoms are pain and pressure around the affected sinuses. Other symptoms include:  Upper toothache.  Earache.  Headache.  Bad breath.  Decreased sense of smell and taste.  A cough, which worsens when you are lying flat.  Fatigue.  Fever.  Thick drainage from your nose, which often is green and   may contain pus (purulent).  Swelling and warmth over the affected sinuses. DIAGNOSIS  Your health care provider will perform a physical exam. During the exam, your health care provider may:  Look in your nose for signs of abnormal growths in your nostrils (nasal polyps).  Tap over the affected sinus to check for signs  of infection.  View the inside of your sinuses (endoscopy) using an imaging device that has a light attached (endoscope). If your health care provider suspects that you have chronic sinusitis, one or more of the following tests may be recommended:  Allergy tests.  Nasal culture. A sample of mucus is taken from your nose, sent to a lab, and screened for bacteria.  Nasal cytology. A sample of mucus is taken from your nose and examined by your health care provider to determine if your sinusitis is related to an allergy. TREATMENT  Most cases of acute sinusitis are related to a viral infection and will resolve on their own within 10 days. Sometimes medicines are prescribed to help relieve symptoms (pain medicine, decongestants, nasal steroid sprays, or saline sprays).  However, for sinusitis related to a bacterial infection, your health care provider will prescribe antibiotic medicines. These are medicines that will help kill the bacteria causing the infection.  Rarely, sinusitis is caused by a fungal infection. In theses cases, your health care provider will prescribe antifungal medicine. For some cases of chronic sinusitis, surgery is needed. Generally, these are cases in which sinusitis recurs more than 3 times per year, despite other treatments. HOME CARE INSTRUCTIONS   Drink plenty of water. Water helps thin the mucus so your sinuses can drain more easily.  Use a humidifier.  Inhale steam 3 to 4 times a day (for example, sit in the bathroom with the shower running).  Apply a warm, moist washcloth to your face 3 to 4 times a day, or as directed by your health care provider.  Use saline nasal sprays to help moisten and clean your sinuses.  Take medicines only as directed by your health care provider.  If you were prescribed either an antibiotic or antifungal medicine, finish it all even if you start to feel better. SEEK IMMEDIATE MEDICAL CARE IF:  You have increasing pain or severe  headaches.  You have nausea, vomiting, or drowsiness.  You have swelling around your face.  You have vision problems.  You have a stiff neck.  You have difficulty breathing. MAKE SURE YOU:   Understand these instructions.  Will watch your condition.  Will get help right away if you are not doing well or get worse. Document Released: 10/16/2005 Document Revised: 03/02/2014 Document Reviewed: 10/31/2011 Exit[employer] Patient Information 2015 [employer]. This information is not intended to replace advice given to you by your health care provider. Make sure you discuss any questions you have with your health care provider.  

## 2014-11-22 NOTE — ED Notes (Addendum)
Patient c/o cough, sore throat, generalized body aches, sinus pressure, and headaches. Per patient cough most dry and nonproductive. Reports pain in chest with deep breath and cough. Per patient symptoms x2 weeks, progressively getting worse. Reports decrease in appetite with dry heavy when coughing. Fevers intermittently and mostly at night.

## 2014-11-22 NOTE — ED Provider Notes (Signed)
CSN: [id number]     Arrival date & time 11/22/14  1139 History   First MD Initiated Contact with Patient 11/22/14 1206     Chief Complaint  Patient presents with  . Generalized Body Aches  . Sore Throat      HPI  Patient presents evaluation of a two-week illness. Started with cough and fevers. Symptoms abated after 2-3 days. Continue nasal congestion facial pain and pressure and continue dry nonproductive cough. States her throat and ribs hurt from coughing. Also diffuse bodyaches. No nausea vomiting diarrhea or GI complaints. Did not receive a flu vaccine.  Past Medical History  Diagnosis Date  . Hypertension   . Hepatitis C   . Drug abuse and dependence   . Iron deficiency anemia   . Tachycardia   . Anxiety   . Murmur   . Leaky heart valve    Past Surgical History  Procedure Laterality Date  . Cesarean section     Family History  Problem Relation Age of Onset  . Stroke Father   . Hypertension Father   . Hypertension Mother    History  Substance Use Topics  . Smoking status: Never Smoker   . Smokeless tobacco: Never Used  . Alcohol Use: Yes     Comment: occasionally   OB History    Gravida Para Term Preterm AB TAB SAB Ectopic Multiple Living   5 3 3  2     3      Review of Systems  Constitutional: Positive for fever. Negative for chills, diaphoresis, appetite change and fatigue.  HENT: Positive for congestion and sinus pressure. Negative for mouth sores, sore throat and trouble swallowing.   Eyes: Negative for visual disturbance.  Respiratory: Positive for cough. Negative for chest tightness, shortness of breath and wheezing.   Cardiovascular: Negative for chest pain.  Gastrointestinal: Negative for nausea, vomiting, abdominal pain, diarrhea and abdominal distention.  Endocrine: Negative for polydipsia, polyphagia and polyuria.  Genitourinary: Negative for dysuria, frequency and hematuria.  Musculoskeletal: Negative for gait problem.  Skin: Negative for color  change, pallor and rash.  Neurological: Negative for dizziness, syncope, light-headedness and headaches.  Hematological: Does not bruise/bleed easily.  Psychiatric/Behavioral: Negative for behavioral problems and confusion.      Allergies  Review of patient's allergies indicates no known allergies.  Home Medications   Prior to Admission medications   Medication Sig Start Date End Date Taking? Authorizing Provider  amLODipine (NORVASC) 10 MG tablet Take 1 tablet (10 mg total) by mouth daily. 07/08/14   Antoine PocheJonathan F Branch, MD  amoxicillin (AMOXIL) 500 MG capsule Take 1 capsule (500 mg total) by mouth 3 (three) times daily. 11/22/14   Rolland PorterMark Ailea Rhatigan, MD  busPIRone (BUSPAR) 5 MG tablet Take 5 mg by mouth 3 (three) times daily.    Historical Provider, MD  chlorpheniramine-HYDROcodone (TUSSIONEX PENNKINETIC ER) 10-8 MG/5ML LQCR Take 5 mLs by mouth every 12 (twelve) hours. 11/22/14   Rolland PorterMark Yorel Redder, MD  citalopram (CELEXA) 10 MG tablet Take 20 mg by mouth daily. For depression. 04/08/13   Fransisca KaufmannLaura Davis, NP  enalapril (VASOTEC) 10 MG tablet Take 1 tablet (10 mg total) by mouth daily. 02/26/14   Antoine PocheJonathan F Branch, MD  ferrous sulfate 325 (65 FE) MG tablet Take 1 tablet (325 mg total) by mouth 2 (two) times daily with a meal. For low blood count. 02/26/14   Antoine PocheJonathan F Branch, MD  fluticasone Aleda Grana(FLONASE) 50 MCG/ACT nasal spray 1 spray each nares bid 11/22/14   Rolland PorterMark Burke Terry,  MD  hydrochlorothiazide (HYDRODIURIL) 25 MG tablet Take 1 tablet (25 mg total) by mouth daily. 07/08/14   Antoine Poche, MD  hydrOXYzine (ATARAX/VISTARIL) 25 MG tablet Take 1 tablet (25 mg total) by mouth every 6 (six) hours as needed for anxiety. 04/08/13   Fransisca Kaufmann, NP   BP 144/96 mmHg  Pulse 88  Temp(Src) 98.2 F (36.8 C) (Oral)  Resp 24  Ht  (1.651 m)  Wt 197 lb (89.359 kg)  BMI 32.78 kg/m2  SpO2 98%  LMP 03/03/2013 Physical Exam  Constitutional: She is oriented to person, place, and time. She appears well-developed and  well-nourished. No distress.  HENT:  Head: Normocephalic.  Nasal congestion. Posterior fractures erythema but no exudate. Supple neck. No cervical adenopathy. Clear lungs. No increased worker breathing, wheezing rales rhonchi.  Eyes: Conjunctivae are normal. Pupils are equal, round, and reactive to light. No scleral icterus.  Neck: Normal range of motion. Neck supple. No thyromegaly present.  Cardiovascular: Normal rate and regular rhythm.  Exam reveals no gallop and no friction rub.   No murmur heard. Pulmonary/Chest: Effort normal and breath sounds normal. No respiratory distress. She has no wheezes. She has no rales.  Abdominal: Soft. Bowel sounds are normal. She exhibits no distension. There is no tenderness. There is no rebound.  Musculoskeletal: Normal range of motion.  Neurological: She is alert and oriented to person, place, and time.  Skin: Skin is warm and dry. No rash noted.  Psychiatric: She has a normal mood and affect. Her behavior is normal.    ED Course  Procedures (including critical care time) Labs Review Labs Reviewed - No data to display  Imaging Review Dg Chest 2 View  11/22/2014   CLINICAL DATA:  Cough, sore throat, generalized body aches  EXAM: CHEST  2 VIEW  COMPARISON:  None.  FINDINGS: Cardiomediastinal silhouette is unremarkable. No acute infiltrate or pleural effusion. No pulmonary edema. Minimal degenerative changes thoracic spine. Mild hyperinflation.  IMPRESSION: No active cardiopulmonary disease.  Mild hyperinflation.   Electronically Signed   By: Natasha Mead M.D.   On: 11/22/2014 12:37     EKG Interpretation None      MDM   Final diagnoses:  Acute sinusitis, recurrence not specified, unspecified location  Bronchitis    Clear lungs. Normal chest x-ray. Not hypoxemic, tachycardic, or tachypneic and not febrile. History for her sinusitis. Amoxicillin, Flonase, potassium neck for cough.    Rolland Porter, MD 11/22/14 1253

## 2015-09-05 ENCOUNTER — Emergency Department (HOSPITAL_COMMUNITY): Payer: Medicaid Other

## 2015-09-05 ENCOUNTER — Encounter (HOSPITAL_COMMUNITY): Payer: Self-pay | Admitting: Emergency Medicine

## 2015-09-05 ENCOUNTER — Emergency Department (HOSPITAL_COMMUNITY)
Admission: EM | Admit: 2015-09-05 | Discharge: 2015-09-05 | Disposition: A | Discharge status: Home / Self Care | Payer: Medicaid Other | Attending: Emergency Medicine | Admitting: Emergency Medicine

## 2015-09-05 DIAGNOSIS — Z792 Long term (current) use of antibiotics: Secondary | ICD-10-CM | POA: Diagnosis not present

## 2015-09-05 DIAGNOSIS — Z79899 Other long term (current) drug therapy: Secondary | ICD-10-CM | POA: Insufficient documentation

## 2015-09-05 DIAGNOSIS — R011 Cardiac murmur, unspecified: Secondary | ICD-10-CM | POA: Insufficient documentation

## 2015-09-05 DIAGNOSIS — Z8619 Personal history of other infectious and parasitic diseases: Secondary | ICD-10-CM | POA: Insufficient documentation

## 2015-09-05 DIAGNOSIS — R1011 Right upper quadrant pain: Secondary | ICD-10-CM

## 2015-09-05 DIAGNOSIS — Y998 Other external cause status: Secondary | ICD-10-CM | POA: Insufficient documentation

## 2015-09-05 DIAGNOSIS — Y9389 Activity, other specified: Secondary | ICD-10-CM | POA: Diagnosis not present

## 2015-09-05 DIAGNOSIS — I10 Essential (primary) hypertension: Secondary | ICD-10-CM | POA: Diagnosis not present

## 2015-09-05 DIAGNOSIS — S3991XA Unspecified injury of abdomen, initial encounter: Secondary | ICD-10-CM | POA: Diagnosis not present

## 2015-09-05 DIAGNOSIS — D509 Iron deficiency anemia, unspecified: Secondary | ICD-10-CM | POA: Insufficient documentation

## 2015-09-05 DIAGNOSIS — F419 Anxiety disorder, unspecified: Secondary | ICD-10-CM | POA: Diagnosis not present

## 2015-09-05 DIAGNOSIS — Y92410 Unspecified street and highway as the place of occurrence of the external cause: Secondary | ICD-10-CM | POA: Diagnosis not present

## 2015-09-05 LAB — HEPATIC FUNCTION PANEL
ALT: 49 U/L (ref 14–54)
AST: 58 U/L — AB (ref 15–41)
Albumin: 4.2 g/dL (ref 3.5–5.0)
Alkaline Phosphatase: 47 U/L (ref 38–126)
Bilirubin, Direct: 0.2 mg/dL (ref 0.1–0.5)
Indirect Bilirubin: 0.8 mg/dL (ref 0.3–0.9)
Total Bilirubin: 1 mg/dL (ref 0.3–1.2)
Total Protein: 8.3 g/dL — ABNORMAL HIGH (ref 6.5–8.1)

## 2015-09-05 LAB — CBC WITH DIFFERENTIAL/PLATELET
BASOS ABS: 0 10*3/uL (ref 0.0–0.1)
Basophils Relative: 0 %
EOS ABS: 0.2 10*3/uL (ref 0.0–0.7)
Eosinophils Relative: 3 %
HCT: 40.9 % (ref 36.0–46.0)
Hemoglobin: 14.2 g/dL (ref 12.0–15.0)
LYMPHS PCT: 35 %
Lymphs Abs: 1.7 10*3/uL (ref 0.7–4.0)
MCH: 31.7 pg (ref 26.0–34.0)
MCHC: 34.7 g/dL (ref 30.0–36.0)
MCV: 91.3 fL (ref 78.0–100.0)
Monocytes Absolute: 0.5 10*3/uL (ref 0.1–1.0)
Monocytes Relative: 11 %
Neutro Abs: 2.5 10*3/uL (ref 1.7–7.7)
Neutrophils Relative %: 50 %
PLATELETS: 206 10*3/uL (ref 150–400)
RBC: 4.48 MIL/uL (ref 3.87–5.11)
RDW: 12.6 % (ref 11.5–15.5)
WBC: 4.9 10*3/uL (ref 4.0–10.5)

## 2015-09-05 LAB — BASIC METABOLIC PANEL
ANION GAP: 11 (ref 5–15)
BUN: 19 mg/dL (ref 6–20)
CO2: 23 mmol/L (ref 22–32)
Calcium: 9.5 mg/dL (ref 8.9–10.3)
Chloride: 103 mmol/L (ref 101–111)
Creatinine, Ser: 0.9 mg/dL (ref 0.44–1.00)
GFR calc Af Amer: >60 mL/min (ref >60)
Glucose, Bld: 108 mg/dL — ABNORMAL HIGH (ref 65–99)
POTASSIUM: 3.8 mmol/L (ref 3.5–5.1)
SODIUM: 137 mmol/L (ref 135–145)

## 2015-09-05 LAB — LIPASE, BLOOD: Lipase: 29 U/L (ref 11–51)

## 2015-09-05 LAB — ETHANOL: Alcohol, Ethyl (B): <5 mg/dL (ref <5)

## 2015-09-05 MED ORDER — SODIUM CHLORIDE 0.9 % IV SOLN
INTRAVENOUS | Status: DC
  Administered 2015-09-05: 500 mL via INTRAVENOUS

## 2015-09-05 MED ORDER — NAPROXEN 500 MG PO TABS: 500.0000 mg | ORAL_TABLET | Freq: Two times a day (BID) | ORAL | Status: DC

## 2015-09-05 MED ORDER — SODIUM CHLORIDE 0.9 % IV BOLUS (SEPSIS)
500.0000 mL | Freq: Once | INTRAVENOUS | Status: AC
  Administered 2015-09-05: 500 mL via INTRAVENOUS

## 2015-09-05 MED ORDER — IOHEXOL 300 MG/ML  SOLN
100.0000 mL | Freq: Once | INTRAMUSCULAR | Status: AC | PRN
  Administered 2015-09-05: 100 mL via INTRAVENOUS
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
## 2015-09-10 ENCOUNTER — Encounter: Payer: Self-pay | Admitting: Obstetrics and Gynecology

## 2015-09-10 ENCOUNTER — Telehealth: Payer: Self-pay | Admitting: Obstetrics and Gynecology

## 2015-09-21 ENCOUNTER — Ambulatory Visit (INDEPENDENT_AMBULATORY_CARE_PROVIDER_SITE_OTHER): Payer: Medicaid Other | Admitting: Obstetrics and Gynecology

## 2015-09-21 ENCOUNTER — Other Ambulatory Visit: Payer: Self-pay | Admitting: Obstetrics and Gynecology

## 2015-09-21 ENCOUNTER — Encounter: Payer: Self-pay | Admitting: Obstetrics and Gynecology

## 2015-09-21 VITALS — BP 210/110 | Ht 65.0 in | Wt 151.0 lb

## 2015-09-21 DIAGNOSIS — R229 Localized swelling, mass and lump, unspecified: Principal | ICD-10-CM

## 2015-09-21 DIAGNOSIS — N838 Other noninflammatory disorders of ovary, fallopian tube and broad ligament: Secondary | ICD-10-CM

## 2015-09-21 DIAGNOSIS — N63 Unspecified lump in breast: Secondary | ICD-10-CM

## 2015-09-21 DIAGNOSIS — N898 Other specified noninflammatory disorders of vagina: Secondary | ICD-10-CM

## 2015-09-21 DIAGNOSIS — IMO0002 Reserved for concepts with insufficient information to code with codable children: Secondary | ICD-10-CM

## 2015-09-21 DIAGNOSIS — N6310 Unspecified lump in the right breast, unspecified quadrant: Secondary | ICD-10-CM

## 2015-09-21 DIAGNOSIS — N839 Noninflammatory disorder of ovary, fallopian tube and broad ligament, unspecified: Secondary | ICD-10-CM

## 2015-09-21 LAB — POCT WET PREP WITH KOH
KOH Prep POC: NEGATIVE
Trichomonas, UA: NEGATIVE
YEAST WET PREP PER HPF POC: NEGATIVE

## 2015-09-21 MED ORDER — HYDROCODONE-ACETAMINOPHEN 5-325 MG PO TABS: 1.0000 | ORAL_TABLET | Freq: Four times a day (QID) | ORAL | Status: DC | PRN

## 2015-09-21 MED ORDER — HYDROCODONE-ACETAMINOPHEN 5-325 MG PO TABS
1.0000 | ORAL_TABLET | Freq: Four times a day (QID) | ORAL | Status: DC | PRN
Start: 2015-09-21 — End: 2015-09-21

## 2015-09-21 MED ORDER — METRONIDAZOLE 500 MG PO TABS: 500.0000 mg | ORAL_TABLET | Freq: Two times a day (BID) | ORAL | Status: DC

## 2015-09-21 NOTE — Addendum Note (Signed)
Addended by: Tilda BurrowFERGUSON, Nasri Boakye V on: 09/21/2015 05:05 PM   Modules accepted: Orders, Level of Service

## 2015-09-21 NOTE — Progress Notes (Signed)
   Subjective:    Patient ID: Brandi Giles, female    DOB: 06/15/1966, 49 y.o.   MRN: [id number]  HPI 2 probs: 1. Breast knot.                       2 pain in rlq, lower abd, noted x 4 wk. Progressive , now continuous                          Pain =reduced with naproxen    Review of Systems Pain noted no relief, esp at night worse when sleeping on right,  Menses.none x 18 mos + vasomotor sx.   last pap 2 yrs. Ago.  Other med probs HTaN Objective:   Physical Exam BP 210/110 mmHg  Ht 5\' 5"  (1.651 m)  Wt 151 lb (68.493 kg)  BMI 25.13 kg/m2  LMP 03/03/2013  Physical Examination: General appearance - alert, well appearing, and in no distress, oriented to person, place, and time, normal appearing weight and chronically ill appearing Mental status - alert, oriented to person, place, and time, normal mood, behavior, speech, dress, motor activity, and thought processes Chest - clear to auscultation, no wheezes, rales or rhonchi, symmetric air entry Breasts - breasts appear normal, right breast has a firm  1 cm mobile nontender nodule at 3 oclock, overlying rib., Axilla neg changes or axillary nodes, left breast normal without mass, skin or nipple changes or axillary nodes Pelvic - normal external genitalia, vulva, vagina, cervix, uterus and adnexa, VULVA: normal appearing vulva with no masses, tenderness or lesions, VAGINA: normal appearing vagina with normal color and discharge, no lesions, CERVIX: normal appearing cervix without discharge or lesions, UTERUS: uterus is normal size, shape, consistency and nontender, ADNEXA: normal adnexa in size, nontender and no masses, mass present right side, size 8 cm, WET MOUNT done - results: clue cells Extremities - peripheral pulses normal, no pedal edema, no clubbing or cyanosis       Assessment & Plan:  Right ov mass Right breast mass.prob benign Hep C not yet on tx.   Plan pelvic u/s         Trial vicodin          Ca 125

## 2015-09-21 NOTE — Progress Notes (Signed)
Patient ID: Brandi Giles, female   DOB: 07/10/1966, 49 y.o.   MRN: [id number] Pt here today for lump in right breast, abdominal pain. Pt states that she has had the lump for about a month and the abdominal pain for over a month. The abdominal pain started out coming and going and is now constant.

## 2015-09-22 ENCOUNTER — Other Ambulatory Visit: Payer: Self-pay | Admitting: Obstetrics and Gynecology

## 2015-09-22 DIAGNOSIS — N838 Other noninflammatory disorders of ovary, fallopian tube and broad ligament: Secondary | ICD-10-CM

## 2015-09-23 LAB — GC/CHLAMYDIA PROBE AMP
Chlamydia trachomatis, NAA: NEGATIVE
NEISSERIA GONORRHOEAE BY PCR: NEGATIVE

## 2015-09-24 NOTE — Telephone Encounter (Signed)
Pt seen, has u/s and mammogram  Ordered, followup dec 6.

## 2015-09-29 ENCOUNTER — Ambulatory Visit (HOSPITAL_COMMUNITY)
Admission: RE | Admit: 2015-09-29 | Discharge: 2015-09-29 | Disposition: A | Discharge status: Home / Self Care | Payer: Medicaid Other | Source: Ambulatory Visit | Attending: Obstetrics and Gynecology | Admitting: Obstetrics and Gynecology

## 2015-09-29 DIAGNOSIS — N838 Other noninflammatory disorders of ovary, fallopian tube and broad ligament: Secondary | ICD-10-CM | POA: Insufficient documentation

## 2015-09-29 DIAGNOSIS — R938 Abnormal findings on diagnostic imaging of other specified body structures: Secondary | ICD-10-CM | POA: Insufficient documentation

## 2015-10-05 ENCOUNTER — Ambulatory Visit (HOSPITAL_COMMUNITY)
Admission: RE | Admit: 2015-10-05 | Discharge: 2015-10-05 | Disposition: A | Discharge status: Home / Self Care | Payer: Medicaid Other | Source: Ambulatory Visit | Attending: Obstetrics and Gynecology | Admitting: Obstetrics and Gynecology

## 2015-10-05 DIAGNOSIS — N63 Unspecified lump in breast: Secondary | ICD-10-CM | POA: Diagnosis not present

## 2015-10-05 DIAGNOSIS — R229 Localized swelling, mass and lump, unspecified: Secondary | ICD-10-CM | POA: Diagnosis present

## 2015-10-05 DIAGNOSIS — IMO0002 Reserved for concepts with insufficient information to code with codable children: Secondary | ICD-10-CM

## 2015-10-12 ENCOUNTER — Encounter: Payer: Self-pay | Admitting: Obstetrics and Gynecology

## 2015-10-12 ENCOUNTER — Ambulatory Visit (INDEPENDENT_AMBULATORY_CARE_PROVIDER_SITE_OTHER): Payer: Self-pay | Admitting: Obstetrics and Gynecology

## 2015-10-12 VITALS — BP 130/80 | Ht 65.0 in | Wt 148.0 lb

## 2015-10-12 DIAGNOSIS — N838 Other noninflammatory disorders of ovary, fallopian tube and broad ligament: Secondary | ICD-10-CM

## 2015-10-12 DIAGNOSIS — N839 Noninflammatory disorder of ovary, fallopian tube and broad ligament, unspecified: Secondary | ICD-10-CM

## 2015-10-12 NOTE — Progress Notes (Signed)
Patient ID: Brandi Giles, female   DOB: 10/13/1966, 49 y.o.   MRN: [id number] Pt here today for follow up visit.

## 2015-10-12 NOTE — Progress Notes (Signed)
Patient ID: Brandi Giles, female   DOB: 06-06-1966, 49 y.o.   MRN: [id number]   [facility]  Patient name: Brandi Giles MRN [id number]  Date of birth: Mar 20, 1966  CC & HPI:  Brandi Giles is a 49 y.o. female presenting today for followup of painful ovary on rt.   ROS:  U/s shows solid ovary , mod enlargement, 5 cm Pt reports postmeno status x 2 yr  Pertinent History Reviewed:   Reviewed: Significant for no wt change Medical         Past Medical History  Diagnosis Date  . Hypertension   . Hepatitis C   . Drug abuse and dependence (HCC)   . Iron deficiency anemia   . Tachycardia   . Anxiety   . Murmur   . Leaky heart valve   . Ovarian cyst                               Surgical Hx:    Past Surgical History  Procedure Laterality Date  . Cesarean section    . Dilation and curettage of uterus     Medications: Reviewed & Updated - see associated section                       Current outpatient prescriptions:  .  amLODipine (NORVASC) 10 MG tablet, Take 1 tablet (10 mg total) by mouth daily., Disp: 90 tablet, Rfl: 3 .  busPIRone (BUSPAR) 5 MG tablet, Take 5 mg by mouth 3 (three) times daily., Disp: , Rfl:  .  citalopram (CELEXA) 10 MG tablet, Take 20 mg by mouth daily. For depression., Disp: , Rfl:  .  enalapril (VASOTEC) 10 MG tablet, Take 1 tablet (10 mg total) by mouth daily. (Patient not taking: Reported on 09/21/2015), Disp: 90 tablet, Rfl: 3 .  ferrous sulfate 325 (65 FE) MG tablet, Take 1 tablet (325 mg total) by mouth 2 (two) times daily with a meal. For low blood count., Disp: 60 tablet, Rfl: 6 .  hydrochlorothiazide (HYDRODIURIL) 25 MG tablet, Take 1 tablet (25 mg total) by mouth daily., Disp: 90 tablet, Rfl: 3 .  HYDROcodone-acetaminophen (NORCO/VICODIN) 5-325 MG tablet, Take 1 tablet by mouth every 6 (six) hours as needed., Disp: 30 tablet, Rfl: 0 .  HYDROcodone-acetaminophen (NORCO/VICODIN) 5-325 MG tablet, Take 1 tablet by mouth every 6  (six) hours as needed., Disp: 30 tablet, Rfl: 0 .  metoprolol succinate (TOPROL-XL) 50 MG 24 hr tablet, Take 50 mg by mouth 2 (two) times daily. Take with or immediately following a meal., Disp: , Rfl:  .  metroNIDAZOLE (FLAGYL) 500 MG tablet, Take 1 tablet (500 mg total) by mouth 2 (two) times daily., Disp: 14 tablet, Rfl: 0   Social History: Reviewed -  reports that she has never smoked. She has never used smokeless tobacco.  Objective Findings:  Vitals: Blood pressure 130/80, height  (1.651 m), weight 148 lb (67.132 kg), last menstrual period 03/03/2013.  Physical Examination: discussion only I spent 15 minutes with the visit with >than 50% spent in counseling and coordination of care.   Assessment & Plan:   A: 1. Postmenopausal enlarged ovary , symptomatic    2 hx hep c in past,treated. P:  1. Check FSH  2. Check Ca125 3 return in Jan, pt to consider surgical options including Laparoscopic RSO,+ LS.  Versus Lavh rso, ls.,  Or  LSH, plus RSO, LS.

## 2015-10-13 LAB — FOLLICLE STIMULATING HORMONE: FSH: 60.8 m[IU]/mL

## 2015-10-13 LAB — CA 125: CA 125: 7.5 U/mL (ref 0.0–38.1)

## 2015-11-03 ENCOUNTER — Other Ambulatory Visit (HOSPITAL_COMMUNITY)
Admission: RE | Admit: 2015-11-03 | Discharge: 2015-11-03 | Disposition: A | Discharge status: Home / Self Care | Payer: Medicaid Other | Source: Ambulatory Visit | Attending: Obstetrics and Gynecology | Admitting: Obstetrics and Gynecology

## 2015-11-03 ENCOUNTER — Ambulatory Visit (INDEPENDENT_AMBULATORY_CARE_PROVIDER_SITE_OTHER): Payer: Self-pay | Admitting: Obstetrics and Gynecology

## 2015-11-03 VITALS — BP 110/76 | Ht 65.0 in | Wt 148.0 lb

## 2015-11-03 DIAGNOSIS — Z01419 Encounter for gynecological examination (general) (routine) without abnormal findings: Secondary | ICD-10-CM | POA: Diagnosis present

## 2015-11-03 DIAGNOSIS — Z01818 Encounter for other preprocedural examination: Secondary | ICD-10-CM

## 2015-11-03 DIAGNOSIS — R894 Abnormal immunological findings in specimens from other organs, systems and tissues: Secondary | ICD-10-CM

## 2015-11-03 DIAGNOSIS — Z1151 Encounter for screening for human papillomavirus (HPV): Secondary | ICD-10-CM | POA: Diagnosis present

## 2015-11-03 DIAGNOSIS — B182 Chronic viral hepatitis C: Secondary | ICD-10-CM

## 2015-11-03 DIAGNOSIS — R768 Other specified abnormal immunological findings in serum: Secondary | ICD-10-CM

## 2015-11-03 MED ORDER — HYDROCODONE-ACETAMINOPHEN 5-325 MG PO TABS: 1.0000 | ORAL_TABLET | Freq: Four times a day (QID) | ORAL | Status: DC | PRN

## 2015-11-03 NOTE — Progress Notes (Signed)
Patient ID: Brandi Giles, female   DOB: 01/18/1966, 50 y.o.   MRN: [id number] Pt here today for pre op exam.

## 2015-11-03 NOTE — Patient Instructions (Signed)
Please speak to our business office re: the [facility] program. Sign up for MyChart.

## 2015-11-03 NOTE — Progress Notes (Signed)
Patient ID: Brandi Giles, female   DOB: 07/28/1966, 50 y.o.   MRN: [id number] Preoperative History and Physical  Brandi Giles is a 50 y.o. [id number] here for followup of pelvic discomfort, dyspareunia, and a symptomatic painful left right ovary,enlarged,in a postmenopausal female  .   significant preoperative concerns: pt has chronic Hep C with no ongoing management.  Proposed surgery: TAHBSO -  Past Medical History  Diagnosis Date  . Hypertension   . Hepatitis C   . Drug abuse and dependence (HCC)   . Iron deficiency anemia   . Tachycardia   . Anxiety   . Murmur   . Leaky heart valve   . Ovarian cyst    Past Surgical History  Procedure Laterality Date  . Cesarean section    . Dilation and curettage of uterus     OB History  Gravida Para Term Preterm AB SAB TAB Ectopic Multiple Living  5 3 3  2     3     # Outcome Date GA Lbr Len/2nd Weight Sex Delivery Anes PTL Lv  5 AB           4 AB           3 Term           2 Term           1 Term             Patient denies any other pertinent gynecologic issues.   Current Outpatient Prescriptions on File Prior to Visit  Medication Sig Dispense Refill  . amLODipine (NORVASC) 10 MG tablet Take 1 tablet (10 mg total) by mouth daily. 90 tablet 3  . busPIRone (BUSPAR) 5 MG tablet Take 5 mg by mouth 3 (three) times daily.    . citalopram (CELEXA) 10 MG tablet Take 20 mg by mouth daily. For depression.    . enalapril (VASOTEC) 10 MG tablet Take 1 tablet (10 mg total) by mouth daily. (Patient not taking: Reported on 09/21/2015) 90 tablet 3  . ferrous sulfate 325 (65 FE) MG tablet Take 1 tablet (325 mg total) by mouth 2 (two) times daily with a meal. For low blood count. 60 tablet 6  . hydrochlorothiazide (HYDRODIURIL) 25 MG tablet Take 1 tablet (25 mg total) by mouth daily. 90 tablet 3  . HYDROcodone-acetaminophen (NORCO/VICODIN) 5-325 MG tablet Take 1 tablet by mouth every 6 (six) hours as needed. 30 tablet 0  .  HYDROcodone-acetaminophen (NORCO/VICODIN) 5-325 MG tablet Take 1 tablet by mouth every 6 (six) hours as needed. 30 tablet 0  . metoprolol succinate (TOPROL-XL) 50 MG 24 hr tablet Take 50 mg by mouth 2 (two) times daily. Take with or immediately following a meal.    . metroNIDAZOLE (FLAGYL) 500 MG tablet Take 1 tablet (500 mg total) by mouth 2 (two) times daily. 14 tablet 0   No current facility-administered medications on file prior to visit.   No Known Allergies  Social History:   reports that she has never smoked. She has never used smokeless tobacco. She reports that she does not drink alcohol or use illicit drugs.  Family History  Problem Relation Age of Onset  . Stroke Father   . Hypertension Father   . Heart failure Father   . Hyperlipidemia Father   . Hypertension Mother   . Cancer Mother     lung, brain    Review of Systems: Noncontributory  PHYSICAL EXAM: Blood pressure 110/76, height 5\' 5"  (1.651 m),  weight 148 lb (67.132 kg), last menstrual period 03/03/2013. General appearance - alert, well appearing, and in no distress Chest - clear to auscultation, no wheezes, rales or rhonchi, symmetric air entry Heart - normal rate and regular rhythm Abdomen - soft, nontender, nondistended, no masses or organomegaly Pelvic - Physical Examination: General appearance - alert, well appearing, and in no distress, oriented to person, place, and time, normal appearing weight and continuous rocking, pt with akasthesia (EPS) Mental status - alert, oriented to person, place, and time Eyes - pupils equal and reactive, extraocular eye movements intact Abdomen - soft, nontender, nondistended, no masses or organomegaly scars from previous incisions pfannenstiel  Pelvic - VULVA: normal appearing vulva with no masses, tenderness or lesions, VAGINA: normal appearing vagina with normal color and discharge, no lesions, PELVIC FLOOR EXAM: no cystocele, rectocele or prolapse noted, CERVIX: normal  appearing cervix without discharge or lesions, cervix tender to contact, i suspect post surgical adhesion at c/s site , UTERUS: tenderness to manipulation, anteverted, fixed,  ADNEXA: tenderness right, mass present right side, size 5 cm,  PAP: Pap smear done today, DNA probe for chlamydia and GC obtained, exam chaperoned by AMT Extremities - peripheral pulses normal, no pedal edema, no clubbing or cyanosis    Labs: No results found for this or any previous visit (from the past 336 hour(s)).  Imaging Studies: Mm Diag Breast Tomo Bilateral  10/05/2015  CLINICAL DATA:  Patient describes palpable lump in the right breast which has recently resolved. No acute issues today. EXAM: DIGITAL DIAGNOSTIC BILATERAL MAMMOGRAM WITH 3D TOMOSYNTHESIS AND CAD COMPARISON:  No prior studies.  This is a baseline mammogram. ACR Breast Density Category b: There are scattered areas of fibroglandular density. FINDINGS: Bilateral CC and MLO projections were obtained with 3D tomosynthesis. Additional spot compression view was obtained of the right breast at the site of patient's previous palpable abnormality, inner right breast, with overlying skin marker in place. There are no dominant masses, suspicious calcifications or secondary signs of malignancy within either breast. Specifically, there is no mammographic abnormality within the inner right breast at the site of patient's previous palpable abnormality. Mammographic images were processed with CAD. IMPRESSION: No evidence of malignancy within either breast. RECOMMENDATION: Screening mammogram in one year.(Code:SM-B-01Y) Patient encouraged to follow-up with referring physician if there was a re-occurrence of the right breast palpable abnormality or if a new palpable abnormality was identified in either breast. I have discussed the findings and recommendations with the patient. Results were also provided in writing at the conclusion of the visit. If applicable, a reminder  letter will be sent to the patient regarding the next appointment. BI-RADS CATEGORY  1: Negative. Electronically Signed   By: Bary Richard M.D.   On: 10/05/2015 16:25    Assessment: Patient Active Problem List   Diagnosis Date Noted  . Chronic diastolic heart failure (HCC) 07/08/2014  . Diastolic dysfunction 03/23/2014  . Heart murmur 11/20/2013  . HTN (hypertension) 11/20/2013  . Cocaine abuse with cocaine-induced mood disorder (HCC) 04/06/2013  akathesia. Pelvic pain Right ovarian mass with normal Ca125, painful Dyspareunia.with uterine contact.  Plan: Pap done today Will check Hep C status, HIV status. Plans for hyst may need delay til pt connected with hepatitis clinic. Pt seeking [place] discount.   .mec 11/03/2015 3:32 PM

## 2015-11-04 DIAGNOSIS — B182 Chronic viral hepatitis C: Secondary | ICD-10-CM | POA: Insufficient documentation

## 2015-11-04 LAB — HIV ANTIBODY (ROUTINE TESTING W REFLEX): HIV Screen 4th Generation wRfx: NONREACTIVE

## 2015-11-04 LAB — HEPATITIS C ANTIBODY (REFLEX)

## 2015-11-04 LAB — COMMENT2 - HEP PANEL

## 2015-11-08 LAB — CYTOLOGY - PAP

## 2015-11-17 ENCOUNTER — Ambulatory Visit (INDEPENDENT_AMBULATORY_CARE_PROVIDER_SITE_OTHER): Payer: Self-pay | Admitting: Obstetrics and Gynecology

## 2015-11-17 ENCOUNTER — Other Ambulatory Visit (HOSPITAL_COMMUNITY)
Admission: RE | Admit: 2015-11-17 | Discharge: 2015-11-17 | Disposition: A | Discharge status: Home / Self Care | Payer: Medicaid Other | Source: Ambulatory Visit | Attending: Obstetrics and Gynecology | Admitting: Obstetrics and Gynecology

## 2015-11-17 ENCOUNTER — Encounter: Payer: Self-pay | Admitting: Obstetrics and Gynecology

## 2015-11-17 VITALS — BP 120/82 | Ht 65.0 in | Wt 142.0 lb

## 2015-11-17 DIAGNOSIS — B182 Chronic viral hepatitis C: Secondary | ICD-10-CM | POA: Diagnosis not present

## 2015-11-17 DIAGNOSIS — A499 Bacterial infection, unspecified: Secondary | ICD-10-CM

## 2015-11-17 DIAGNOSIS — Z01818 Encounter for other preprocedural examination: Secondary | ICD-10-CM

## 2015-11-17 DIAGNOSIS — B9689 Other specified bacterial agents as the cause of diseases classified elsewhere: Secondary | ICD-10-CM

## 2015-11-17 DIAGNOSIS — N760 Acute vaginitis: Secondary | ICD-10-CM

## 2015-11-17 DIAGNOSIS — N390 Urinary tract infection, site not specified: Secondary | ICD-10-CM

## 2015-11-17 DIAGNOSIS — R300 Dysuria: Secondary | ICD-10-CM

## 2015-11-17 LAB — POCT URINALYSIS DIPSTICK
Blood, UA: 3
Glucose, UA: NEGATIVE
Ketones, UA: NEGATIVE
NITRITE UA: NEGATIVE

## 2015-11-17 LAB — COMPREHENSIVE METABOLIC PANEL
ALBUMIN: 3.9 g/dL (ref 3.5–5.0)
ALT: 27 U/L (ref 14–54)
ANION GAP: 8 (ref 5–15)
AST: 22 U/L (ref 15–41)
Alkaline Phosphatase: 49 U/L (ref 38–126)
BUN: 14 mg/dL (ref 6–20)
CHLORIDE: 100 mmol/L — AB (ref 101–111)
CO2: 26 mmol/L (ref 22–32)
Calcium: 9.3 mg/dL (ref 8.9–10.3)
Creatinine, Ser: 0.89 mg/dL (ref 0.44–1.00)
GFR calc Af Amer: >60 mL/min (ref >60)
GFR calc non Af Amer: >60 mL/min (ref >60)
GLUCOSE: 108 mg/dL — AB (ref 65–99)
POTASSIUM: 3.5 mmol/L (ref 3.5–5.1)
SODIUM: 134 mmol/L — AB (ref 135–145)
TOTAL PROTEIN: 8.9 g/dL — AB (ref 6.5–8.1)
Total Bilirubin: 0.9 mg/dL (ref 0.3–1.2)

## 2015-11-17 LAB — CBC
HCT: 37.6 % (ref 36.0–46.0)
Hemoglobin: 12.4 g/dL (ref 12.0–15.0)
MCH: 30.4 pg (ref 26.0–34.0)
MCHC: 33 g/dL (ref 30.0–36.0)
MCV: 92.2 fL (ref 78.0–100.0)
PLATELETS: 224 10*3/uL (ref 150–400)
RBC: 4.08 MIL/uL (ref 3.87–5.11)
RDW: 12.7 % (ref 11.5–15.5)
WBC: 7.2 10*3/uL (ref 4.0–10.5)

## 2015-11-17 MED ORDER — PHENAZOPYRIDINE HCL 200 MG PO TABS: 200.0000 mg | ORAL_TABLET | Freq: Three times a day (TID) | ORAL | Status: DC | PRN

## 2015-11-17 MED ORDER — METRONIDAZOLE 500 MG PO TABS: 500.0000 mg | ORAL_TABLET | Freq: Two times a day (BID) | ORAL | Status: DC

## 2015-11-17 MED ORDER — HYDROCODONE-ACETAMINOPHEN 5-325 MG PO TABS
1.0000 | ORAL_TABLET | Freq: Four times a day (QID) | ORAL | Status: DC | PRN
Start: 2015-11-17 — End: 2015-12-30

## 2015-11-17 MED ORDER — SULFAMETHOXAZOLE-TRIMETHOPRIM 400-80 MG PO TABS: 1.0000 | ORAL_TABLET | Freq: Two times a day (BID) | ORAL | Status: DC

## 2015-11-17 NOTE — Progress Notes (Signed)
Patient ID: Brandi Giles, female   DOB: August 20, 1966, 50 y.o.   MRN: [id number] Pt here today for follow up. Pt states that she now has blood in her urine. Pt states that the hydrocodone is no longer working. Pt states that she has to take 1 tablet every 4 hours. Pt states that has burning with urination and pain. Pt states that she has swelling at her clitoris and that it throbs. Pt states that she has the throbbing every day.

## 2015-11-17 NOTE — Progress Notes (Signed)
Patient ID: Brandi Giles, female   DOB: 05-Mar-1966, 50 y.o.   MRN: [id number]   [facility] Visit  Patient name: Brandi Giles MRN [id number]  Date of birth: 07/28/1966  CC & HPI:  Brandi Giles is a 50 y.o. female presenting today for  1.suprapubic pain 2. rlq pain (lesser) in area of known rt ovary 3. Vaginal odor recurrent 4 hasn't f/u on aHep C  ROS:  Now eligible for [facility]. Hasn't applied for medicaid.  Pertinent History Reviewed:   Reviewed: Significant for chronic Hep C x 15 yr, never tx'd never followed up. Medical         Past Medical History  Diagnosis Date  . Hypertension   . Hepatitis C   . Drug abuse and dependence (HCC)   . Iron deficiency anemia   . Tachycardia   . Anxiety   . Murmur   . Leaky heart valve   . Ovarian cyst                               Surgical Hx:    Past Surgical History  Procedure Laterality Date  . Cesarean section    . Dilation and curettage of uterus     Medications: Reviewed & Updated - see associated section                       Current outpatient prescriptions:  .  amLODipine (NORVASC) 10 MG tablet, Take 1 tablet (10 mg total) by mouth daily., Disp: 90 tablet, Rfl: 3 .  busPIRone (BUSPAR) 5 MG tablet, Take 5 mg by mouth 3 (three) times daily., Disp: , Rfl:  .  citalopram (CELEXA) 10 MG tablet, Take 20 mg by mouth daily. For depression., Disp: , Rfl:  .  ferrous sulfate 325 (65 FE) MG tablet, Take 1 tablet (325 mg total) by mouth 2 (two) times daily with a meal. For low blood count., Disp: 60 tablet, Rfl: 6 .  hydrochlorothiazide (HYDRODIURIL) 25 MG tablet, Take 1 tablet (25 mg total) by mouth daily., Disp: 90 tablet, Rfl: 3 .  HYDROcodone-acetaminophen (NORCO/VICODIN) 5-325 MG tablet, Take 1 tablet by mouth every 6 (six) hours as needed., Disp: 60 tablet, Rfl: 0 .  metoprolol succinate (TOPROL-XL) 50 MG 24 hr tablet, Take 50 mg by mouth 2 (two) times daily. Take with or immediately following a meal.,  Disp: , Rfl:  .  enalapril (VASOTEC) 10 MG tablet, Take 1 tablet (10 mg total) by mouth daily. (Patient not taking: Reported on 09/21/2015), Disp: 90 tablet, Rfl: 3 .  metroNIDAZOLE (FLAGYL) 500 MG tablet, Take 1 tablet (500 mg total) by mouth 2 (two) times daily., Disp: 14 tablet, Rfl: 0 .  phenazopyridine (PYRIDIUM) 200 MG tablet, Take 1 tablet (200 mg total) by mouth 3 (three) times daily as needed for pain., Disp: 10 tablet, Rfl: 0 .  sulfamethoxazole-trimethoprim (BACTRIM) 400-80 MG tablet, Take 1 tablet by mouth 2 (two) times daily. Twice daily for uti, Disp: 14 tablet, Rfl: 0   Social History: Reviewed -  reports that she has never smoked. She has never used smokeless tobacco.  Objective Findings:  Vitals: Blood pressure 120/82, height  (1.651 m), weight 142 lb (64.411 kg), last menstrual period 03/03/2013.  Physical Examination: General appearance - alert, well appearing, and in no distress, oriented to person, place, and time and chronically ill appearing Mental status - alert,  oriented to person, place, and time, moderate discomfort suprapubic Abdomen - soft, nontender, nondistended, no masses or organomegaly tenderness noted suprapubic no rebound tenderness noted Pelvic - VULVA: normal appearing vulva with no masses, tenderness or lesions, VAGINA: normal appearing vagina with normal color and discharge, no lesions, vaginal discharge - copious and creamy, CERVIX: normal appearing cervix without discharge or lesions, DNA probe for chlamydia and GC obtained, cervical motion tenderness present, UTERUS: uterus is normal size, shape, consistency and nontender, tenderness probable uti /cystitis with bladder tenderness, anteverted, ADNEXA: mass present right side, size 5 cm Extremities - peripheral pulses normal, no pedal edema, no clubbing or cyanosis   Assessment & Plan:   A:  1. 1. UTI 2. BV 3 untreated HeP C 4 stable rt ovary with normal ca125  P:  1. Tx uti and bv , 2 draw  labs for viral Hep C rna load 3 refer to Gastroenterology

## 2015-11-18 LAB — PMP SCREEN PROFILE (10S), URINE
AMPHETAMINE SCRN UR: NEGATIVE ng/mL
Barbiturate Screen, Ur: NEGATIVE ng/mL
Benzodiazepine Screen, Urine: NEGATIVE ng/mL
COCAINE(METAB.) SCREEN, URINE: POSITIVE ng/mL
CREATININE(CRT), U: 219.5 mg/dL (ref 20.0–300.0)
Cannabinoids Ur Ql Scn: NEGATIVE ng/mL
METHADONE SCREEN, URINE: NEGATIVE ng/mL
OPIATE SCRN UR: NEGATIVE ng/mL
OXYCODONE+OXYMORPHONE UR QL SCN: NEGATIVE ng/mL
PCP SCRN UR: NEGATIVE ng/mL
PROPOXYPHENE SCREEN: NEGATIVE ng/mL
Ph of Urine: 5.9 (ref 4.5–8.9)

## 2015-11-18 LAB — GC/CHLAMYDIA PROBE AMP
Chlamydia trachomatis, NAA: NEGATIVE
NEISSERIA GONORRHOEAE BY PCR: NEGATIVE

## 2015-11-18 LAB — HCV RNA QUANT
HCV QUANT LOG: 6.104 {Log_IU}/mL (ref >1.70)
HCV Quantitative: 1270000 IU/mL (ref >50)

## 2015-11-18 LAB — URINE CULTURE

## 2015-11-22 ENCOUNTER — Telehealth: Payer: Self-pay | Admitting: *Deleted

## 2015-11-22 ENCOUNTER — Telehealth: Payer: Self-pay | Admitting: Obstetrics and Gynecology

## 2015-11-22 NOTE — Telephone Encounter (Signed)
Left message for pt to call office for results. ( to discuss urine drug screen)

## 2015-11-24 NOTE — Telephone Encounter (Signed)
I left message that I will be in office Thrusday, when we can discuss her + UDS (cocaine), and alleged difficulty gettng seen by GI

## 2015-11-26 ENCOUNTER — Telehealth: Payer: Self-pay | Admitting: Obstetrics and Gynecology

## 2015-11-26 NOTE — Telephone Encounter (Signed)
Pt returns my call. We discussed her continued POSITIVE UDS FOR COCAINE. Pt acknowledges intermittent use. I have informed pt that we cannot assume the risk of drug interaction with Cocaine, and that her quitting Cocaine is a NON-negotiable mandatory expectation for [facility] care. Pt acknowledges our discussion.

## 2015-11-30 ENCOUNTER — Telehealth: Payer: Self-pay | Admitting: Obstetrics and Gynecology

## 2015-12-03 NOTE — Telephone Encounter (Signed)
I left message for pt to update [place] on status of referral to GI. Also pt to update [place] on application for medicaid , as with POS hx of Hep C, pt is likely eligible. Pt previously informed of her + UDS foir cocaine., and need for that to stop to be considered for surgery.

## 2015-12-06 ENCOUNTER — Telehealth: Payer: Self-pay | Admitting: Obstetrics and Gynecology

## 2015-12-06 DIAGNOSIS — B182 Chronic viral hepatitis C: Secondary | ICD-10-CM

## 2015-12-06 NOTE — Telephone Encounter (Signed)
Pt aware that referral to GI doctor has been sent.

## 2015-12-06 NOTE — Addendum Note (Signed)
Addended by: Richardson Chiquito on: 12/06/2015 04:11 PM   Modules accepted: Orders

## 2015-12-06 NOTE — Telephone Encounter (Signed)
Pt has not yet been referred to Gastroenterologist for treatment of her Hep C.  Pt has not completed Medicaid application, has been seeking [place] discount. I have advised pt that medicaid application must be made. Pt reports that she has an appt at [facility] on Dec 15 2015 at 10:15 am

## 2015-12-08 ENCOUNTER — Ambulatory Visit: Payer: Self-pay | Admitting: Obstetrics and Gynecology

## 2015-12-30 ENCOUNTER — Telehealth: Payer: Self-pay | Admitting: Obstetrics and Gynecology

## 2015-12-30 ENCOUNTER — Encounter: Payer: Self-pay | Admitting: Obstetrics and Gynecology

## 2015-12-30 ENCOUNTER — Other Ambulatory Visit: Payer: Self-pay | Admitting: Obstetrics and Gynecology

## 2015-12-30 DIAGNOSIS — R768 Other specified abnormal immunological findings in serum: Secondary | ICD-10-CM | POA: Insufficient documentation

## 2015-12-30 DIAGNOSIS — B182 Chronic viral hepatitis C: Secondary | ICD-10-CM

## 2015-12-30 MED ORDER — HYDROCODONE-ACETAMINOPHEN 5-325 MG PO TABS: 1.0000 | ORAL_TABLET | Freq: Four times a day (QID) | ORAL | Status: DC | PRN

## 2015-12-30 NOTE — Telephone Encounter (Signed)
Left message for pt to followup with gastroenterology. Pt has been given info for referral appt in past. And she had medicaid application appt at [facility] in Feb.

## 2016-01-06 DIAGNOSIS — Z029 Encounter for administrative examinations, unspecified: Secondary | ICD-10-CM

## 2016-02-07 ENCOUNTER — Emergency Department (HOSPITAL_COMMUNITY)
Admission: EM | Admit: 2016-02-07 | Discharge: 2016-02-07 | Disposition: A | Discharge status: Home / Self Care | Payer: Medicaid Other | Attending: Emergency Medicine | Admitting: Emergency Medicine

## 2016-02-07 ENCOUNTER — Encounter (HOSPITAL_COMMUNITY): Payer: Self-pay | Admitting: *Deleted

## 2016-02-07 ENCOUNTER — Emergency Department (HOSPITAL_COMMUNITY): Payer: Medicaid Other

## 2016-02-07 DIAGNOSIS — N390 Urinary tract infection, site not specified: Secondary | ICD-10-CM | POA: Diagnosis not present

## 2016-02-07 DIAGNOSIS — B349 Viral infection, unspecified: Secondary | ICD-10-CM | POA: Diagnosis not present

## 2016-02-07 DIAGNOSIS — I10 Essential (primary) hypertension: Secondary | ICD-10-CM | POA: Diagnosis not present

## 2016-02-07 DIAGNOSIS — R05 Cough: Secondary | ICD-10-CM | POA: Diagnosis present

## 2016-02-07 LAB — COMPREHENSIVE METABOLIC PANEL
ALK PHOS: 36 U/L — AB (ref 38–126)
ALT: 55 U/L — ABNORMAL HIGH (ref 14–54)
ANION GAP: 9 (ref 5–15)
AST: 58 U/L — ABNORMAL HIGH (ref 15–41)
Albumin: 3.5 g/dL (ref 3.5–5.0)
BILIRUBIN TOTAL: 0.3 mg/dL (ref 0.3–1.2)
BUN: 15 mg/dL (ref 6–20)
CALCIUM: 8.4 mg/dL — AB (ref 8.9–10.3)
CO2: 22 mmol/L (ref 22–32)
Chloride: 104 mmol/L (ref 101–111)
Creatinine, Ser: 0.92 mg/dL (ref 0.44–1.00)
GFR calc non Af Amer: >60 mL/min (ref >60)
Glucose, Bld: 106 mg/dL — ABNORMAL HIGH (ref 65–99)
Potassium: 3.2 mmol/L — ABNORMAL LOW (ref 3.5–5.1)
Sodium: 135 mmol/L (ref 135–145)
TOTAL PROTEIN: 8.1 g/dL (ref 6.5–8.1)

## 2016-02-07 LAB — CBC WITH DIFFERENTIAL/PLATELET
BASOS ABS: 0 10*3/uL (ref 0.0–0.1)
BASOS PCT: 0 %
EOS ABS: 0 10*3/uL (ref 0.0–0.7)
Eosinophils Relative: 0 %
HCT: 38.6 % (ref 36.0–46.0)
HEMOGLOBIN: 13.4 g/dL (ref 12.0–15.0)
Lymphocytes Relative: 20 %
Lymphs Abs: 1.2 10*3/uL (ref 0.7–4.0)
MCH: 31.9 pg (ref 26.0–34.0)
MCHC: 34.7 g/dL (ref 30.0–36.0)
MCV: 91.9 fL (ref 78.0–100.0)
Monocytes Absolute: 1.3 10*3/uL — ABNORMAL HIGH (ref 0.1–1.0)
Monocytes Relative: 22 %
NEUTROS PCT: 58 %
Neutro Abs: 3.5 10*3/uL (ref 1.7–7.7)
Platelets: 165 10*3/uL (ref 150–400)
RBC: 4.2 MIL/uL (ref 3.87–5.11)
RDW: 12.7 % (ref 11.5–15.5)
WBC: 6 10*3/uL (ref 4.0–10.5)

## 2016-02-07 LAB — LIPASE, BLOOD: LIPASE: 38 U/L (ref 11–51)

## 2016-02-07 LAB — URINALYSIS, ROUTINE W REFLEX MICROSCOPIC
BILIRUBIN URINE: NEGATIVE
Glucose, UA: NEGATIVE mg/dL
Nitrite: NEGATIVE
PH: 5.5 (ref 5.0–8.0)
Protein, ur: 100 mg/dL — AB
SPECIFIC GRAVITY, URINE: 1.015 (ref 1.005–1.030)

## 2016-02-07 LAB — URINE MICROSCOPIC-ADD ON: SQUAMOUS EPITHELIAL / LPF: NONE SEEN

## 2016-02-07 LAB — CBG MONITORING, ED: GLUCOSE-CAPILLARY: 103 mg/dL — AB (ref 65–99)

## 2016-02-07 MED ORDER — CEPHALEXIN 500 MG PO CAPS
500.0000 mg | ORAL_CAPSULE | Freq: Once | ORAL | Status: AC
  Administered 2016-02-07: 500 mg via ORAL
  Filled 2016-02-07: qty 1

## 2016-02-07 MED ORDER — HYDROCOD POLST-CPM POLST ER 10-8 MG/5ML PO SUER
5.0000 mL | Freq: Once | ORAL | Status: AC
  Administered 2016-02-07: 5 mL via ORAL
  Filled 2016-02-07: qty 5

## 2016-02-07 MED ORDER — PROMETHAZINE-CODEINE 6.25-10 MG/5ML PO SYRP: 5.0000 mL | ORAL_SOLUTION | ORAL | Status: DC | PRN

## 2016-02-07 MED ORDER — CEPHALEXIN 500 MG PO CAPS: 500.0000 mg | ORAL_CAPSULE | Freq: Four times a day (QID) | ORAL | Status: DC

## 2016-02-07 NOTE — Discharge Instructions (Signed)
Urinary Tract Infection °Urinary tract infections (UTIs) can develop anywhere along your urinary tract. Your urinary tract is your body's drainage system for removing wastes and extra water. Your urinary tract includes two kidneys, two ureters, a bladder, and a urethra. Your kidneys are a pair of bean-shaped organs. Each kidney is about the size of your fist. They are located below your ribs, one on each side of your spine. °CAUSES °Infections are caused by microbes, which are microscopic organisms, including fungi, viruses, and bacteria. These organisms are so small that they can only be seen through a microscope. Bacteria are the microbes that most commonly cause UTIs. °SYMPTOMS  °Symptoms of UTIs may vary by age and gender of the patient and by the location of the infection. Symptoms in young women typically include a frequent and intense urge to urinate and a painful, burning feeling in the bladder or urethra during urination. Older women and men are more likely to be tired, shaky, and weak and have muscle aches and abdominal pain. A fever may mean the infection is in your kidneys. Other symptoms of a kidney infection include pain in your back or sides below the ribs, nausea, and vomiting. °DIAGNOSIS °To diagnose a UTI, your caregiver will ask you about your symptoms. Your caregiver will also ask you to provide a urine sample. The urine sample will be tested for bacteria and white blood cells. White blood cells are made by your body to help fight infection. °TREATMENT  °Typically, UTIs can be treated with medication. Because most UTIs are caused by a bacterial infection, they usually can be treated with the use of antibiotics. The choice of antibiotic and length of treatment depend on your symptoms and the type of bacteria causing your infection. °HOME CARE INSTRUCTIONS °· If you were prescribed antibiotics, take them exactly as your caregiver instructs you. Finish the medication even if you feel better after  you have only taken some of the medication. °· Drink enough water and fluids to keep your urine clear or pale yellow. °· Avoid caffeine, tea, and carbonated beverages. They tend to irritate your bladder. °· Empty your bladder often. Avoid holding urine for long periods of time. °· Empty your bladder before and after sexual intercourse. °· After a bowel movement, women should cleanse from front to back. Use each tissue only once. °SEEK MEDICAL CARE IF:  °· You have back pain. °· You develop a fever. °· Your symptoms do not begin to resolve within 3 days. °SEEK IMMEDIATE MEDICAL CARE IF:  °· You have severe back pain or lower abdominal pain. °· You develop chills. °· You have nausea or vomiting. °· You have continued burning or discomfort with urination. °MAKE SURE YOU:  °· Understand these instructions. °· Will watch your condition. °· Will get help right away if you are not doing well or get worse. °  °This information is not intended to replace advice given to you by your health care provider. Make sure you discuss any questions you have with your health care provider. °  °Document Released: 07/26/2005 Document Revised: 07/07/2015 Document Reviewed: 11/24/2011 °Elsevier Interactive Patient Education ©2016 [employer] ° °Viral Infections °A viral infection can be caused by different types of viruses. Most viral infections are not serious and resolve on their own. However, some infections may cause severe symptoms and may lead to further complications. °SYMPTOMS °Viruses can frequently cause: °· Minor sore throat. °· Aches and pains. °· Headaches. °· Runny nose. °· Different types of rashes. °·   Watery eyes. °· Tiredness. °· Cough. °· Loss of appetite. °· Gastrointestinal infections, resulting in nausea, vomiting, and diarrhea. °These symptoms do not respond to antibiotics because the infection is not caused by bacteria. However, you might catch a bacterial infection following the viral infection. This is sometimes  called a "superinfection." Symptoms of such a bacterial infection may include: °· Worsening sore throat with pus and difficulty swallowing. °· Swollen neck glands. °· Chills and a high or persistent fever. °· Severe headache. °· Tenderness over the sinuses. °· Persistent overall ill feeling (malaise), muscle aches, and tiredness (fatigue). °· Persistent cough. °· Yellow, green, or brown mucus production with coughing. °HOME CARE INSTRUCTIONS  °· Only take over-the-counter or prescription medicines for pain, discomfort, diarrhea, or fever as directed by your caregiver. °· Drink enough water and fluids to keep your urine clear or pale yellow. Sports drinks can provide valuable electrolytes, sugars, and hydration. °· Get plenty of rest and maintain proper nutrition. Soups and broths with crackers or rice are fine. °SEEK IMMEDIATE MEDICAL CARE IF:  °· You have severe headaches, shortness of breath, chest pain, neck pain, or an unusual rash. °· You have uncontrolled vomiting, diarrhea, or you are unable to keep down fluids. °· You or your child has an oral temperature above 102° F (38.9° C), not controlled by medicine. °· Your baby is older than 3 months with a rectal temperature of 102° F (38.9° C) or higher. °· Your baby is 3 months old or younger with a rectal temperature of 100.4° F (38° C) or higher. °MAKE SURE YOU:  °· Understand these instructions. °· Will watch your condition. °· Will get help right away if you are not doing well or get worse. °  °This information is not intended to replace advice given to you by your health care provider. Make sure you discuss any questions you have with your health care provider. °  °Document Released: 07/26/2005 Document Revised: 01/08/2012 Document Reviewed: 03/24/2015 °Elsevier Interactive Patient Education ©2016 Elsevier Inc. ° °

## 2016-02-07 NOTE — ED Notes (Signed)
Pt c/o frequent urination with "razor sharp pain". UA to be collected.

## 2016-02-07 NOTE — ED Notes (Signed)
Pt comes in for cough, congestion, and facial pain starting on Saturday. Pt denies any n/v/d.

## 2016-02-09 NOTE — ED Provider Notes (Signed)
CSN: [id number]     Arrival date & time 02/07/16  0736 History   First MD Initiated Contact with Patient 02/07/16 0802     Chief Complaint  Patient presents with  . Cough     (Consider location/radiation/quality/duration/timing/severity/associated sxs/prior Treatment) The history is provided by the patient.   Brandi Giles is a 50 y.o. female presenting with 2 complaints, the first being uri type symptoms starting 2 days ago including cough productive of green sputum, nasal congestion with sinus pressure and clear rhinorrhea.  She denies shortness of breath, chest pain, headache, ear pain, dizziness, epistaxis, hemoptysis.  She has had no medicines for her symtpoms prior to arrival.  Secondly, reports has frequent uti's, most recently treated at an outside facility last month with antibiotics (she does recall the name).  She reports having frequent painful urination along with cloudy urine. She denies vaginal discharge, denies back or flank pain, but does endorse suprapubic constant pressure.      Past Medical History  Diagnosis Date  . Hypertension   . Hepatitis C   . Drug abuse and dependence (HCC)   . Iron deficiency anemia   . Tachycardia   . Anxiety   . Murmur   . Leaky heart valve   . Ovarian cyst    Past Surgical History  Procedure Laterality Date  . Cesarean section    . Dilation and curettage of uterus     Family History  Problem Relation Age of Onset  . Stroke Father   . Hypertension Father   . Heart failure Father   . Hyperlipidemia Father   . Hypertension Mother   . Cancer Mother     lung, brain   Social History  Substance Use Topics  . Smoking status: Never Smoker   . Smokeless tobacco: Never Used  . Alcohol Use: No     Comment: occasionally   OB History    Gravida Para Term Preterm AB TAB SAB Ectopic Multiple Living   5 3 3  2     3      Review of Systems  Constitutional: Negative for fever and chills.  HENT: Positive for congestion,  rhinorrhea and sinus pressure. Negative for ear pain, sore throat, trouble swallowing and voice change.   Eyes: Negative for discharge.  Respiratory: Positive for cough. Negative for shortness of breath, wheezing and stridor.   Cardiovascular: Negative for chest pain.  Gastrointestinal: Negative for abdominal pain.  Genitourinary: Positive for dysuria, urgency and frequency. Negative for hematuria, vaginal discharge and menstrual problem.      Allergies  Review of patient's allergies indicates no known allergies.  Home Medications   Prior to Admission medications   Medication Sig Start Date End Date Taking? Authorizing Provider  amLODipine (NORVASC) 10 MG tablet Take 1 tablet (10 mg total) by mouth daily. 07/08/14  Yes Antoine PocheJonathan F Branch, MD  busPIRone (BUSPAR) 5 MG tablet Take 5 mg by mouth 3 (three) times daily.   Yes Historical Provider, MD  citalopram (CELEXA) 10 MG tablet Take 20 mg by mouth daily. For depression. 04/08/13  Yes Thermon LeylandLaura A Davis, NP  hydrochlorothiazide (HYDRODIURIL) 25 MG tablet Take 1 tablet (25 mg total) by mouth daily. 07/08/14  Yes Antoine PocheJonathan F Branch, MD  HYDROcodone-acetaminophen (NORCO/VICODIN) 5-325 MG tablet Take 1 tablet by mouth every 6 (six) hours as needed. 12/30/15  Yes Tilda BurrowJohn Ferguson V, MD  metoprolol succinate (TOPROL-XL) 50 MG 24 hr tablet Take 50 mg by mouth 2 (two) times daily. Take with  or immediately following a meal.   Yes Historical Provider, MD  metroNIDAZOLE (FLAGYL) 500 MG tablet Take 1 tablet (500 mg total) by mouth 2 (two) times daily. 11/17/15  Yes Tilda Burrow, MD  cephALEXin (KEFLEX) 500 MG capsule Take 1 capsule (500 mg total) by mouth 4 (four) times daily. 02/07/16   Burgess Amor, PA-C  phenazopyridine (PYRIDIUM) 200 MG tablet Take 1 tablet (200 mg total) by mouth 3 (three) times daily as needed for pain. Patient not taking: Reported on 02/07/2016 11/17/15   Tilda Burrow, MD  promethazine-codeine [facility] WITH CODEINE) 6.25-10 MG/5ML syrup Take 5  mLs by mouth every 4 (four) hours as needed for cough. 02/07/16   Burgess Amor, PA-C  sulfamethoxazole-trimethoprim (BACTRIM) 400-80 MG tablet Take 1 tablet by mouth 2 (two) times daily. Twice daily for uti Patient not taking: Reported on 02/07/2016 11/17/15   Tilda Burrow, MD   BP 107/76 mmHg  Pulse 69  Temp(Src) 98.4 F (36.9 C) (Oral)  Resp 17  Ht  (1.651 m)  Wt 61.236 kg  BMI 22.47 kg/m2  SpO2 98%  LMP 03/03/2013 Physical Exam  Constitutional: She is oriented to person, place, and time. She appears well-developed and well-nourished.  HENT:  Head: Normocephalic and atraumatic.  Right Ear: Tympanic membrane and ear canal normal.  Left Ear: Tympanic membrane and ear canal normal.  Nose: Rhinorrhea present. No mucosal edema.  Mouth/Throat: Uvula is midline, oropharynx is clear and moist and mucous membranes are normal. No oropharyngeal exudate, posterior oropharyngeal edema, posterior oropharyngeal erythema or tonsillar abscesses.  Eyes: Conjunctivae are normal.  Cardiovascular: Normal rate and normal heart sounds.   Pulmonary/Chest: Effort normal. No respiratory distress. She has no decreased breath sounds. She has no wheezes. She has no rales.  Coarse breath sounds. No wheezing.  Frequent dry cough.  Abdominal: Soft. There is no tenderness. There is no guarding and no CVA tenderness.  Musculoskeletal: Normal range of motion.  Neurological: She is alert and oriented to person, place, and time.  Skin: Skin is warm and dry. No rash noted.  Psychiatric: She has a normal mood and affect.    ED Course  Procedures (including critical care time) Labs Review Labs Reviewed  URINE CULTURE - Abnormal; Notable for the following:    Culture >=100,000 COLONIES/mL ESCHERICHIA COLI (*)    All other components within normal limits  URINALYSIS, ROUTINE W REFLEX MICROSCOPIC (NOT AT [facility]) - Abnormal; Notable for the following:    APPearance CLOUDY (*)    Hgb urine dipstick MODERATE (*)     Ketones, ur TRACE (*)    Protein, ur 100 (*)    Leukocytes, UA LARGE (*)    All other components within normal limits  COMPREHENSIVE METABOLIC PANEL - Abnormal; Notable for the following:    Potassium 3.2 (*)    Glucose, Bld 106 (*)    Calcium 8.4 (*)    AST 58 (*)    ALT 55 (*)    Alkaline Phosphatase 36 (*)    All other components within normal limits  CBC WITH DIFFERENTIAL/PLATELET - Abnormal; Notable for the following:    Monocytes Absolute 1.3 (*)    All other components within normal limits  URINE MICROSCOPIC-ADD ON - Abnormal; Notable for the following:    Bacteria, UA MANY (*)    All other components within normal limits  CBG MONITORING, ED - Abnormal; Notable for the following:    Glucose-Capillary 103 (*)    All other components within  normal limits  LIPASE, BLOOD    Imaging Review No results found. I have personally reviewed and evaluated these images and lab results as part of my medical decision-making.   EKG Interpretation None      MDM   Final diagnoses:  Viral syndrome  UTI (lower urinary tract infection)   Patients  labs reviewed.  Radiological studies were viewed, interpreted and considered during the medical decision making and disposition process. I agree with radiologists reading.  Results were also discussed with patient.  Slight bump in LFT's which pt states are chronically elevated with her Hep C.  Trying to get tx for this.  [facility] health de[facility]. Advised f/u with her pcp.  Urine cx ordered. Pt placed on keflex, first dose given. Phenergan/codeine cough syrup. Increased fluids. Strict return precautions discussed.  No exam findings suggesting pyelonephritis.   The patient appears reasonably screened and/or stabilized for discharge and I doubt any other medical condition or other [facility] requiring further screening, evaluation, or treatment in the ED at this time prior to discharge.     Burgess Amor, PA-C 02/09/16 0981  Vanetta Mulders,  MD 02/10/16 1718

## 2016-02-10 LAB — URINE CULTURE

## 2016-02-11 ENCOUNTER — Telehealth: Payer: Self-pay

## 2016-02-11 NOTE — Telephone Encounter (Signed)
Post ED Visit - Positive Culture Follow-up  Culture report reviewed by antimicrobial stewardship pharmacist:  []  Enzo BiNathan Batchelder, Pharm.D. []  Celedonio MiyamotoJeremy Frens, [address]Ph[address] BCPS []  Garvin FilaMike Maccia, Pharm.D. []  Georgina PillionElizabeth Martin, [address]Ph[address] BCPS []  RavennaMinh Pham, [place]., BCPS, AAHIVP []  Estella HuskMichelle Turner, Pharm.D., BCPS, AAHIVP []  Tennis Mustassie Stewart, Pharm.D. []  Sherle Poeob Vincent, [place]arm[place]D. Gennaro AfricaMegan Mills, PharmD Positive urine culture Treated with Cephalexin 500 mg 4x daily, organism sensitive to the same and no further patient follow-up is required at this time.  Jerry CarasCullom, Flay Ghosh Burnett 02/11/2016, 11:33 AM

## 2016-04-07 ENCOUNTER — Emergency Department (HOSPITAL_COMMUNITY): Payer: No Typology Code available for payment source

## 2016-04-07 ENCOUNTER — Emergency Department (HOSPITAL_COMMUNITY)
Admission: EM | Admit: 2016-04-07 | Discharge: 2016-04-08 | Disposition: A | Discharge status: Home / Self Care | Payer: No Typology Code available for payment source | Attending: Emergency Medicine | Admitting: Emergency Medicine

## 2016-04-07 ENCOUNTER — Encounter (HOSPITAL_COMMUNITY): Payer: Self-pay

## 2016-04-07 DIAGNOSIS — Y9389 Activity, other specified: Secondary | ICD-10-CM | POA: Diagnosis not present

## 2016-04-07 DIAGNOSIS — Z79899 Other long term (current) drug therapy: Secondary | ICD-10-CM | POA: Diagnosis not present

## 2016-04-07 DIAGNOSIS — I10 Essential (primary) hypertension: Secondary | ICD-10-CM | POA: Diagnosis not present

## 2016-04-07 DIAGNOSIS — F1410 Cocaine abuse, uncomplicated: Secondary | ICD-10-CM | POA: Insufficient documentation

## 2016-04-07 DIAGNOSIS — R4182 Altered mental status, unspecified: Secondary | ICD-10-CM | POA: Diagnosis not present

## 2016-04-07 DIAGNOSIS — S0101XA Laceration without foreign body of scalp, initial encounter: Secondary | ICD-10-CM | POA: Diagnosis not present

## 2016-04-07 DIAGNOSIS — Y999 Unspecified external cause status: Secondary | ICD-10-CM | POA: Insufficient documentation

## 2016-04-07 DIAGNOSIS — S0990XA Unspecified injury of head, initial encounter: Secondary | ICD-10-CM

## 2016-04-07 DIAGNOSIS — Y92410 Unspecified street and highway as the place of occurrence of the external cause: Secondary | ICD-10-CM | POA: Insufficient documentation

## 2016-04-07 LAB — RAPID URINE DRUG SCREEN, HOSP PERFORMED
Amphetamines: NOT DETECTED
BARBITURATES: NOT DETECTED
BENZODIAZEPINES: POSITIVE — AB
COCAINE: POSITIVE — AB
Opiates: NOT DETECTED
Tetrahydrocannabinol: NOT DETECTED

## 2016-04-07 LAB — BASIC METABOLIC PANEL
Anion gap: 7 (ref 5–15)
BUN: 16 mg/dL (ref 6–20)
CALCIUM: 9 mg/dL (ref 8.9–10.3)
CO2: 24 mmol/L (ref 22–32)
CREATININE: 0.72 mg/dL (ref 0.44–1.00)
Chloride: 108 mmol/L (ref 101–111)
GFR calc Af Amer: >60 mL/min (ref >60)
Glucose, Bld: 84 mg/dL (ref 65–99)
Potassium: 3.5 mmol/L (ref 3.5–5.1)
SODIUM: 139 mmol/L (ref 135–145)

## 2016-04-07 LAB — URINALYSIS, ROUTINE W REFLEX MICROSCOPIC
Bilirubin Urine: NEGATIVE
Glucose, UA: NEGATIVE mg/dL
KETONES UR: NEGATIVE mg/dL
LEUKOCYTES UA: NEGATIVE
NITRITE: NEGATIVE
PH: 5.5 (ref 5.0–8.0)
Protein, ur: NEGATIVE mg/dL
SPECIFIC GRAVITY, URINE: 1.01 (ref 1.005–1.030)

## 2016-04-07 LAB — CBC
HCT: 36.8 % (ref 36.0–46.0)
Hemoglobin: 12.5 g/dL (ref 12.0–15.0)
MCH: 31.6 pg (ref 26.0–34.0)
MCHC: 34 g/dL (ref 30.0–36.0)
MCV: 92.9 fL (ref 78.0–100.0)
PLATELETS: 215 10*3/uL (ref 150–400)
RBC: 3.96 MIL/uL (ref 3.87–5.11)
RDW: 12.7 % (ref 11.5–15.5)
WBC: 8.2 10*3/uL (ref 4.0–10.5)

## 2016-04-07 LAB — PROTIME-INR
INR: 1.19 (ref 0.00–1.49)
PROTHROMBIN TIME: 15.3 s — AB (ref 11.6–15.2)

## 2016-04-07 LAB — ETHANOL: ALCOHOL ETHYL (B): 11 mg/dL — AB (ref <5)

## 2016-04-07 LAB — TYPE AND SCREEN
ABO/RH(D): O POS
Antibody Screen: NEGATIVE

## 2016-04-07 LAB — URINE MICROSCOPIC-ADD ON

## 2016-04-07 MED ORDER — KETAMINE HCL 50 MG/ML IJ SOLN
INTRAMUSCULAR | Status: AC
  Administered 2016-04-07: 50 mg via INTRAVENOUS
  Filled 2016-04-07: qty 10

## 2016-04-07 MED ORDER — IBUPROFEN 800 MG PO TABS: 800.0000 mg | ORAL_TABLET | Freq: Three times a day (TID) | ORAL | Status: DC

## 2016-04-07 MED ORDER — KETAMINE HCL 10 MG/ML IJ SOLN: 50.0000 mg | Freq: Once | INTRAMUSCULAR | Status: DC

## 2016-04-07 NOTE — ED Notes (Signed)
Patient ambulatory 50 feet with stand by assist. Patient c/o pain to right wrist and mid back. edp notified and arrived at bedside at this time.

## 2016-04-07 NOTE — ED Notes (Signed)
Escorted patient to and from Radiology. Patient remains drowsy, but responsive to pain and verbal stimulation.
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
## 2016-04-07 NOTE — ED Notes (Signed)
Visitor arrived to bedside
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
## 2016-04-08 NOTE — ED Notes (Signed)
Escorted pt to the bathroom - pt able to ambulate with standby assist. Pt urinated approx 70 cc of yellow urine. No blood observed.

## 2016-04-17 ENCOUNTER — Emergency Department (HOSPITAL_COMMUNITY)
Admission: EM | Admit: 2016-04-17 | Discharge: 2016-04-17 | Disposition: A | Discharge status: Home / Self Care | Payer: No Typology Code available for payment source | Attending: Emergency Medicine | Admitting: Emergency Medicine

## 2016-04-17 ENCOUNTER — Encounter (HOSPITAL_COMMUNITY): Payer: Self-pay | Admitting: Emergency Medicine

## 2016-04-17 DIAGNOSIS — S0101XD Laceration without foreign body of scalp, subsequent encounter: Secondary | ICD-10-CM

## 2016-04-17 DIAGNOSIS — I10 Essential (primary) hypertension: Secondary | ICD-10-CM | POA: Insufficient documentation

## 2016-04-17 DIAGNOSIS — Z79899 Other long term (current) drug therapy: Secondary | ICD-10-CM | POA: Insufficient documentation

## 2016-04-17 DIAGNOSIS — X58XXXD Exposure to other specified factors, subsequent encounter: Secondary | ICD-10-CM | POA: Insufficient documentation

## 2016-04-17 MED ORDER — LIDOCAINE-PRILOCAINE 2.5-2.5 % EX CREA
TOPICAL_CREAM | Freq: Once | CUTANEOUS | Status: AC
  Administered 2016-04-17: 08:00:00 via TOPICAL
  Filled 2016-04-17: qty 5

## 2016-04-17 MED ORDER — LIDOCAINE-PRILOCAINE 2.5-2.5 % EX CREA: TOPICAL_CREAM | Freq: Once | CUTANEOUS | Status: DC

## 2016-04-17 NOTE — ED Provider Notes (Signed)
CSN: [id number]     Arrival date & time 04/17/16  0720 History   First MD Initiated Contact with Patient 04/17/16 [phone]     Chief Complaint  Patient presents with  . Suture / Staple Removal      Patient is a 50 y.o. female presenting with suture removal. The history is provided by the patient.  Suture / Staple Removal This is a new problem. The current episode started more than 1 week ago. The problem occurs daily. The problem has not changed since onset.Nothing aggravates the symptoms. Nothing relieves the symptoms.  pt here to have 4 staples removed from scalp No fever No drainage  No pain reported  Past Medical History  Diagnosis Date  . Hypertension   . Hepatitis C   . Drug abuse and dependence (HCC)   . Iron deficiency anemia   . Tachycardia   . Anxiety   . Murmur   . Leaky heart valve   . Ovarian cyst    Past Surgical History  Procedure Laterality Date  . Cesarean section    . Dilation and curettage of uterus     Family History  Problem Relation Age of Onset  . Stroke Father   . Hypertension Father   . Heart failure Father   . Hyperlipidemia Father   . Hypertension Mother   . Cancer Mother     lung, brain   Social History  Substance Use Topics  . Smoking status: Never Smoker   . Smokeless tobacco: Never Used  . Alcohol Use: No     Comment: occasionally   OB History    Gravida Para Term Preterm AB TAB SAB Ectopic Multiple Living   5 3 3  2     3      Review of Systems  Skin: Positive for wound.  Psychiatric/Behavioral: The patient is nervous/anxious.       Allergies  Review of patient's allergies indicates no known allergies.  Home Medications   Prior to Admission medications   Medication Sig Start Date End Date Taking? Authorizing Provider  amLODipine (NORVASC) 10 MG tablet Take 1 tablet (10 mg total) by mouth daily. 07/08/14   Antoine PocheJonathan F Branch, MD  busPIRone (BUSPAR) 5 MG tablet Take 5 mg by mouth 3 (three) times daily.    Historical  Provider, MD  citalopram (CELEXA) 10 MG tablet Take 20 mg by mouth daily. For depression. 04/08/13   Thermon LeylandLaura A Davis, NP  hydrochlorothiazide (HYDRODIURIL) 25 MG tablet Take 1 tablet (25 mg total) by mouth daily. 07/08/14   Antoine PocheJonathan F Branch, MD  ibuprofen (ADVIL,MOTRIN) 800 MG tablet Take 1 tablet (800 mg total) by mouth 3 (three) times daily. 04/07/16   Eber HongBrian Miller, MD  metoprolol succinate (TOPROL-XL) 50 MG 24 hr tablet Take 50 mg by mouth 2 (two) times daily. Take with or immediately following a meal.    Historical Provider, MD  promethazine-codeine (PHENERGAN WITH CODEINE) 6.25-10 MG/5ML syrup Take 5 mLs by mouth every 4 (four) hours as needed for cough. 02/07/16   Burgess AmorJulie Idol, PA-C   BP 172/112 mmHg  Pulse 77  Temp(Src) 97.7 F (36.5 C)  Resp 18  Ht 5\' 5"  (1.651 m)  Wt 63.504 kg  BMI 23.30 kg/m2  SpO2 98%  LMP 03/03/2013 Physical Exam CONSTITUTIONAL: Well developed/well nourished HEAD: Normocephalic/atraumatic, well healing wound noted, no drainage/erythema EYES: EOMI ENMT: Mucous membranes moist NECK: supple no meningeal signs NEURO: Pt is awake/alert/appropriate, moves all extremitiesx4.  EXTREMITIES: full ROM SKIN: warm, color  normal PSYCH: no abnormalities of mood noted, alert and oriented to situation  ED Course  .Suture Removal Date/Time: 04/17/2016 8:30 AM Performed by: Zadie Rhine Authorized by: Zadie Rhine Consent: Verbal consent obtained. Staples Removed: 4 Facility: sutures placed in this facility Patient tolerance: Patient tolerated the procedure well with no immediate complications     MDM   Final diagnoses:  Scalp laceration, subsequent encounter    Nursing notes including past medical history and social history reviewed and considered in documentation     Zadie Rhine, MD 04/17/16 [phone]

## 2016-04-17 NOTE — ED Notes (Signed)
Pt here for staple removal from head.  

## 2016-04-17 NOTE — ED Notes (Signed)
MD at bedside. 

## 2016-05-25 ENCOUNTER — Emergency Department (HOSPITAL_COMMUNITY)
Admission: EM | Admit: 2016-05-25 | Discharge: 2016-05-25 | Disposition: A | Discharge status: Home / Self Care | Payer: Medicaid Other | Attending: Emergency Medicine | Admitting: Emergency Medicine

## 2016-05-25 ENCOUNTER — Encounter (HOSPITAL_COMMUNITY): Payer: Self-pay

## 2016-05-25 DIAGNOSIS — F329 Major depressive disorder, single episode, unspecified: Secondary | ICD-10-CM | POA: Insufficient documentation

## 2016-05-25 DIAGNOSIS — I110 Hypertensive heart disease with heart failure: Secondary | ICD-10-CM | POA: Insufficient documentation

## 2016-05-25 DIAGNOSIS — I5032 Chronic diastolic (congestive) heart failure: Secondary | ICD-10-CM | POA: Insufficient documentation

## 2016-05-25 DIAGNOSIS — Z791 Long term (current) use of non-steroidal anti-inflammatories (NSAID): Secondary | ICD-10-CM | POA: Insufficient documentation

## 2016-05-25 DIAGNOSIS — Z79899 Other long term (current) drug therapy: Secondary | ICD-10-CM | POA: Insufficient documentation

## 2016-05-25 DIAGNOSIS — F4321 Adjustment disorder with depressed mood: Secondary | ICD-10-CM | POA: Insufficient documentation

## 2016-05-25 DIAGNOSIS — F32A Depression, unspecified: Secondary | ICD-10-CM
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
## 2017-05-11 ENCOUNTER — Encounter (HOSPITAL_COMMUNITY): Payer: Self-pay | Admitting: Emergency Medicine

## 2017-05-11 ENCOUNTER — Emergency Department (HOSPITAL_COMMUNITY): Payer: Medicaid Other

## 2017-05-11 ENCOUNTER — Emergency Department (HOSPITAL_COMMUNITY)
Admission: EM | Admit: 2017-05-11 | Discharge: 2017-05-11 | Disposition: A | Discharge status: Home / Self Care | Payer: Medicaid Other | Attending: Emergency Medicine | Admitting: Emergency Medicine

## 2017-05-11 DIAGNOSIS — I10 Essential (primary) hypertension: Secondary | ICD-10-CM | POA: Insufficient documentation

## 2017-05-11 DIAGNOSIS — Y939 Activity, unspecified: Secondary | ICD-10-CM | POA: Insufficient documentation

## 2017-05-11 DIAGNOSIS — Y999 Unspecified external cause status: Secondary | ICD-10-CM | POA: Insufficient documentation

## 2017-05-11 DIAGNOSIS — Y929 Unspecified place or not applicable: Secondary | ICD-10-CM | POA: Insufficient documentation

## 2017-05-11 DIAGNOSIS — W01190A Fall on same level from slipping, tripping and stumbling with subsequent striking against furniture, initial encounter: Secondary | ICD-10-CM | POA: Insufficient documentation

## 2017-05-11 DIAGNOSIS — Z23 Encounter for immunization: Secondary | ICD-10-CM | POA: Insufficient documentation

## 2017-05-11 DIAGNOSIS — S0083XA Contusion of other part of head, initial encounter: Secondary | ICD-10-CM

## 2017-05-11 DIAGNOSIS — S0181XA Laceration without foreign body of other part of head, initial encounter: Secondary | ICD-10-CM | POA: Insufficient documentation

## 2017-05-11 DIAGNOSIS — Z79899 Other long term (current) drug therapy: Secondary | ICD-10-CM | POA: Insufficient documentation

## 2017-05-11 MED ORDER — TRAMADOL HCL 50 MG PO TABS: ORAL_TABLET | ORAL | 0 refills | Status: DC

## 2017-05-11 MED ORDER — TETANUS-DIPHTH-ACELL PERTUSSIS 5-2.5-18.5 LF-MCG/0.5 IM SUSP
0.5000 mL | Freq: Once | INTRAMUSCULAR | Status: AC
  Administered 2017-05-11: 0.5 mL via INTRAMUSCULAR
  Filled 2017-05-11: qty 0.5

## 2017-05-11 MED ORDER — PROMETHAZINE HCL 12.5 MG PO TABS
12.5000 mg | ORAL_TABLET | Freq: Once | ORAL | Status: AC
  Administered 2017-05-11: 12.5 mg via ORAL
  Filled 2017-05-11: qty 1

## 2017-05-11 MED ORDER — HYDROCODONE-ACETAMINOPHEN 5-325 MG PO TABS
2.0000 | ORAL_TABLET | Freq: Once | ORAL | Status: AC
  Administered 2017-05-11: 2 via ORAL
  Filled 2017-05-11: qty 2

## 2017-05-11 NOTE — ED Triage Notes (Signed)
Pt fell face first into a wooden coffee table corner and has laceration left brow, has had moderately heavy bleeding

## 2017-05-11 NOTE — ED Provider Notes (Signed)
[facility] Provider Note   CSN: [id number] Arrival date & time: 05/11/17  0048     History   Chief Complaint No chief complaint on file.   HPI Brandi Giles is a 51 y.o. female.  Patient is a 51 year old female who presents to the emergency department with injury to the 4 head and face.  The patient states that she tripped on a rug and fell face first on a wooden table. She sustained a laceration to the 4 head and injury to the face. The patient denies being on any anticoagulation medication. She denies loss of consciousness. There's been no vision changes. She states however she has a terrible headache. His been no operations or procedures involving her face or head. She has been able to only partially controlled the bleeding by applying pressure.      Past Medical History:  Diagnosis Date  . Anxiety   . Drug abuse and dependence (HCC)   . Hepatitis C   . Hypertension   . Iron deficiency anemia   . Leaky heart valve   . Murmur   . Ovarian cyst   . Tachycardia     Patient Active Problem List   Diagnosis Date Noted  . Hepatitis C antibody test positive 12/30/2015  . Burning with urination 11/17/2015  . UTI (lower urinary tract infection) 11/17/2015  . BV (bacterial vaginosis) 11/17/2015  . Hep C w/o coma, chronic (HCC) 11/04/2015  . Chronic diastolic heart failure (HCC) 07/08/2014  . Diastolic dysfunction 03/23/2014  . Heart murmur 11/20/2013  . HTN (hypertension) 11/20/2013  . Cocaine abuse with cocaine-induced mood disorder (HCC) 04/06/2013    Past Surgical History:  Procedure Laterality Date  . CESAREAN SECTION    . DILATION AND CURETTAGE OF UTERUS      OB History    Gravida Para Term Preterm AB Living   5 3 3   2 3    SAB TAB Ectopic Multiple Live Births                   Home Medications    Prior to Admission medications   Medication Sig Start Date End Date Taking? Authorizing Provider  amLODipine (NORVASC) 10 MG tablet Take 1  tablet (10 mg total) by mouth daily. 07/08/14   Antoine Poche, MD  busPIRone (BUSPAR) 5 MG tablet Take 5 mg by mouth 3 (three) times daily.    [provider]  citalopram (CELEXA) 10 MG tablet Take 20 mg by mouth daily. For depression. 04/08/13   Thermon Leyland, NP  hydrochlorothiazide (HYDRODIURIL) 25 MG tablet Take 1 tablet (25 mg total) by mouth daily. 07/08/14   Antoine Poche, MD  ibuprofen (ADVIL,MOTRIN) 800 MG tablet Take 1 tablet (800 mg total) by mouth 3 (three) times daily. 04/07/16   Eber Hong, MD  metoprolol succinate (TOPROL-XL) 50 MG 24 hr tablet Take 50 mg by mouth 2 (two) times daily. Take with or immediately following a meal.    [provider]  promethazine-codeine (PHENERGAN WITH CODEINE) 6.25-10 MG/5ML syrup Take 5 mLs by mouth every 4 (four) hours as needed for cough. 02/07/16   Burgess Amor, PA-C  traMADol Janean Sark) 50 MG tablet 1 or 2 po q6h prn pain 05/11/17   Ivery Quale, PA-C    Family History Family History  Problem Relation Age of Onset  . Stroke Father   . Hypertension Father   . Heart failure Father   . Hyperlipidemia Father   . Hypertension Mother   .  Cancer Mother        lung, brain    Social History Social History  Substance Use Topics  . Smoking status: Never Smoker  . Smokeless tobacco: Never Used  . Alcohol use 1.2 oz/week    2 Cans of beer per week     Comment: weekly     Allergies   Patient has no known allergies.   Review of Systems Review of Systems  Constitutional: Negative for activity change.       All ROS Neg except as noted in HPI  HENT: Negative for nosebleeds.   Eyes: Negative for photophobia and discharge.  Respiratory: Negative for cough, shortness of breath and wheezing.   Cardiovascular: Negative for chest pain and palpitations.  Gastrointestinal: Negative for abdominal pain and blood in stool.  Genitourinary: Negative for dysuria, frequency and hematuria.  Musculoskeletal: Negative for  arthralgias, back pain and neck pain.  Skin: Negative.   Neurological: Positive for headaches. Negative for dizziness, seizures and speech difficulty.  Psychiatric/Behavioral: Negative for confusion and hallucinations. The patient is nervous/anxious.      Physical Exam Updated Vital Signs BP (!) 184/120 (BP Location: Right Arm)   Pulse 80   Temp 98.2 F (36.8 C) (Oral)   Resp 20   Ht 5\' 5"  (1.651 m)   Wt 59 kg (130 lb)   LMP 03/03/2013   SpO2 100%   BMI 21.63 kg/m   Physical Exam  Constitutional: She is oriented to person, place, and time. She appears well-developed and well-nourished.  Non-toxic appearance.  HENT:  Head: Normocephalic. Head is with laceration. Head is without Battle's sign.    Right Ear: Tympanic membrane and external ear normal.  Left Ear: Tympanic membrane and external ear normal.  Eyes: Pupils are equal, round, and reactive to light. EOM and lids are normal.  Neck: Normal range of motion. Neck supple. Carotid bruit is not present.  Cardiovascular: Normal rate, regular rhythm, normal heart sounds, intact distal pulses and normal pulses.   Pulmonary/Chest: Breath sounds normal. No respiratory distress.  Abdominal: Soft. Bowel sounds are normal. There is no tenderness. There is no guarding.  Musculoskeletal: Normal range of motion.  Lymphadenopathy:       Head (right side): No submandibular adenopathy present.       Head (left side): No submandibular adenopathy present.    She has no cervical adenopathy.  Neurological: She is alert and oriented to person, place, and time. She has normal strength. No cranial nerve deficit or sensory deficit.  Skin: Skin is warm and dry.  Psychiatric: She has a normal mood and affect. Her speech is normal.  Nursing note and vitals reviewed.    ED Treatments / Results  Labs (all labs ordered are listed, but only abnormal results are displayed) Labs Reviewed - No data to display  EKG  EKG Interpretation None        Radiology No results found.  Procedures .Marland Kitchen.Laceration Repair Date/Time: 05/11/2017 2:17 AM Performed by: Ivery QualeBRYANT, Heath Tesler Authorized by: Ivery QualeBRYANT, Brewer Hitchman   Consent:    Consent obtained:  Verbal   Consent given by:  Patient   Risks discussed:  Pain, poor cosmetic result and poor wound healing Anesthesia (see MAR for exact dosages):    Anesthesia method:  Local infiltration   Local anesthetic:  Bupivacaine 0.25% w/o epi Laceration details:    Location:  Face   Face location:  Forehead   Length (cm):  1.4 Repair type:    Repair type: Pt would not  allow repair. Pre-procedure details:    Preparation:  Patient was prepped and draped in usual sterile fashion Exploration:    Hemostasis achieved with:  Direct pressure   Wound exploration comment:  Pt would not allow the wound to be explored. Treatment:    Area cleansed with:  Saline   Amount of cleaning:  Standard   Irrigation solution:  Sterile saline Skin repair:    Repair method: Pt wound not allow repair. Post-procedure details:    Dressing:  Bulky dressing (Pt would not allow repair. Bulky pressure dressing applied.)   Patient tolerance of procedure:  Procedure terminated at patient's request   (including critical care time)  Medications Ordered in ED Medications  Tdap (BOOSTRIX) injection 0.5 mL (not administered)  HYDROcodone-acetaminophen (NORCO/VICODIN) 5-325 MG per tablet 2 tablet (not administered)  promethazine (PHENERGAN) tablet 12.5 mg (not administered)     Initial Impression / Assessment and Plan / ED Course  I have reviewed the triage vital signs and the nursing notes.  Pertinent labs & imaging results that were available during my care of the patient were reviewed by me and considered in my medical decision making (see chart for details).      Final Clinical Impressions(s) / ED Diagnoses MDM Blood pressure elevated at 184/120. Vital signs are otherwise within normal limits. Patient sustained a  laceration to the forehead and bruise to the 4 head and face. Attempted to repair the laceration. Patient states that she had a bed experience during repair of a laceration following an accident, and her nerves are just too bad to go through with it. We discussed the importance of stopping the bleeding. We discussed the importance of preventing infection. And we discussed the problem with attempting to close the wound after some any hours. The patient states that her nerves just can't take it and she takes responsibility for not having the wound closed. The patient states she just wants to get her tetanus shot some medicine for pain and leave.  Patient treated with Norco in the emergency department. Prescription for Ultram given to the patient to use for pain. A bulky pressure dressing was applied by me. Bleeding seems to have resolved after the pressure dressing applied. The patient is advised to see her primary physician or return to the emergency department if the bleeding is not resolving. Patient acknowledges understanding of the instructions.    Final diagnoses:  Laceration of forehead, initial encounter  Contusion of forehead, initial encounter    New Prescriptions New Prescriptions   TRAMADOL (ULTRAM) 50 MG TABLET    1 or 2 po q6h prn pain     Ivery Quale, PA-C 05/11/17 1610    Devoria Albe, MD 05/11/17 [phone]

## 2017-05-11 NOTE — ED Notes (Signed)
Pt is refusing the CT scan, states she will return if she has further concerns or headache

## 2017-05-11 NOTE — Discharge Instructions (Signed)
Please keep the pressure dressing in place until the morning of July 14. Change dressing daily until wound heals. Please Brandi Giles your medical records that you received a tetanus shot tonight. Use Tylenol or ibuprofen for mild pain, use Ultram for more severe pain.This medication may cause drowsiness. Please do not drink, drive, or participate in activity that requires concentration while taking this medication.

## 2017-08-19 ENCOUNTER — Encounter (HOSPITAL_COMMUNITY): Payer: Self-pay | Admitting: Emergency Medicine

## 2017-08-19 ENCOUNTER — Emergency Department (HOSPITAL_COMMUNITY)
Admission: EM | Admit: 2017-08-19 | Discharge: 2017-08-19 | Disposition: A | Discharge status: Home / Self Care | Payer: Self-pay | Attending: Emergency Medicine | Admitting: Emergency Medicine

## 2017-08-19 DIAGNOSIS — N898 Other specified noninflammatory disorders of vagina: Secondary | ICD-10-CM | POA: Insufficient documentation

## 2017-08-19 DIAGNOSIS — Z79899 Other long term (current) drug therapy: Secondary | ICD-10-CM | POA: Insufficient documentation

## 2017-08-19 DIAGNOSIS — G5683 Other specified mononeuropathies of bilateral upper limbs: Secondary | ICD-10-CM | POA: Insufficient documentation

## 2017-08-19 DIAGNOSIS — T148XXA Other injury of unspecified body region, initial encounter: Secondary | ICD-10-CM

## 2017-08-19 DIAGNOSIS — R200 Anesthesia of skin: Secondary | ICD-10-CM | POA: Insufficient documentation

## 2017-08-19 DIAGNOSIS — I10 Essential (primary) hypertension: Secondary | ICD-10-CM | POA: Insufficient documentation

## 2017-08-19 LAB — WET PREP, GENITAL
SPERM: NONE SEEN
TRICH WET PREP: NONE SEEN
Yeast Wet Prep HPF POC: NONE SEEN

## 2017-08-19 LAB — URINALYSIS, ROUTINE W REFLEX MICROSCOPIC
Bilirubin Urine: NEGATIVE
Glucose, UA: NEGATIVE mg/dL
HGB URINE DIPSTICK: NEGATIVE
KETONES UR: NEGATIVE mg/dL
Leukocytes, UA: NEGATIVE
Nitrite: NEGATIVE
PROTEIN: NEGATIVE mg/dL
Specific Gravity, Urine: 1.027 (ref 1.005–1.030)
pH: 5 (ref 5.0–8.0)

## 2017-08-19 MED ORDER — OXYCODONE-ACETAMINOPHEN 5-325 MG PO TABS
1.0000 | ORAL_TABLET | Freq: Once | ORAL | Status: AC
  Administered 2017-08-19: 1 via ORAL
  Filled 2017-08-19: qty 1

## 2017-08-19 MED ORDER — DOXYCYCLINE HYCLATE 100 MG PO CAPS: 100.0000 mg | ORAL_CAPSULE | Freq: Two times a day (BID) | ORAL | 0 refills | Status: AC

## 2017-08-19 MED ORDER — CEFTRIAXONE SODIUM 250 MG IJ SOLR
250.0000 mg | Freq: Once | INTRAMUSCULAR | Status: AC
  Administered 2017-08-19: 250 mg via INTRAMUSCULAR
  Filled 2017-08-19: qty 250

## 2017-08-19 MED ORDER — METRONIDAZOLE 500 MG PO TABS: 500.0000 mg | ORAL_TABLET | Freq: Two times a day (BID) | ORAL | 0 refills | Status: AC

## 2017-08-19 MED ORDER — LIDOCAINE HCL (PF) 1 % IJ SOLN
INTRAMUSCULAR | Status: AC
  Administered 2017-08-19: 2 mL
  Filled 2017-08-19: qty 2

## 2017-08-19 MED ORDER — AZITHROMYCIN 1 G PO PACK
1.0000 g | PACK | Freq: Once | ORAL | Status: AC
  Administered 2017-08-19: 1 g via ORAL
  Filled 2017-08-19: qty 1

## 2017-08-19 MED ORDER — GABAPENTIN 100 MG PO CAPS: 100.0000 mg | ORAL_CAPSULE | Freq: Three times a day (TID) | ORAL | 0 refills | Status: DC

## 2017-08-19 MED ORDER — PREDNISONE 50 MG PO TABS
60.0000 mg | ORAL_TABLET | Freq: Once | ORAL | Status: AC
  Administered 2017-08-19: 60 mg via ORAL
  Filled 2017-08-19: qty 1

## 2017-08-19 MED ORDER — GABAPENTIN 100 MG PO CAPS
100.0000 mg | ORAL_CAPSULE | Freq: Once | ORAL | Status: AC
  Administered 2017-08-19: 100 mg via ORAL
  Filled 2017-08-19: qty 1

## 2017-08-19 MED ORDER — PREDNISONE 20 MG PO TABS: ORAL_TABLET | ORAL | 0 refills | Status: DC

## 2017-08-19 MED ORDER — HYDROCODONE-ACETAMINOPHEN 5-325 MG PO TABS: 1.0000 | ORAL_TABLET | Freq: Two times a day (BID) | ORAL | 0 refills | Status: DC | PRN

## 2017-08-19 NOTE — ED Triage Notes (Signed)
Patient c/o bilateral arm pain that starts from elbows and radiates into hands. Per patient hands are numb. Denies any injury. 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 Radial pulses present. Patient also requesting to be checked for possible STD. Patient states thick yellow discharge with lower abd pain. Unsure of any odor.

## 2017-08-20 NOTE — ED Provider Notes (Signed)
[facility] EMERGENCY DEPARTMENT Provider Note   CSN: [id number] Arrival date & time: 08/19/17  1817     History   Chief Complaint Chief Complaint  Patient presents with  . Arm Pain    HPI Brandi Giles is a 50 y.o. female.   Arm Pain  This is a new problem. The current episode started more than 1 week ago. The problem occurs constantly. The problem has been gradually worsening. 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 The symptoms are relieved by rest.    Past Medical History:  Diagnosis Date  . Anxiety   . Drug abuse and dependence (HCC)   . Hepatitis C   . Hypertension   . Iron deficiency anemia   . Leaky heart valve   . Murmur   . Ovarian cyst   . Tachycardia     Patient Active Problem List   Diagnosis Date Noted  . Hepatitis C antibody test positive 12/30/2015  . Burning with urination 11/17/2015  . UTI (lower urinary tract infection) 11/17/2015  . BV (bacterial vaginosis) 11/17/2015  . Hep C w/o coma, chronic (HCC) 11/04/2015  . Chronic diastolic heart failure (HCC) 07/08/2014  . Diastolic dysfunction 03/23/2014  . Heart murmur 11/20/2013  . HTN (hypertension) 11/20/2013  . Cocaine abuse with cocaine-induced mood disorder (HCC) 04/06/2013    Past Surgical History:  Procedure Laterality Date  . CESAREAN SECTION    . DILATION AND CURETTAGE OF UTERUS      OB History    Gravida Para Term Preterm AB Living   5 3 3   2 3    SAB TAB Ectopic Multiple Live Births                   Home Medications    Prior to Admission medications   Medication Sig Start Date End Date Taking? Authorizing Provider  amLODipine (NORVASC) 10 MG tablet Take 1 tablet (10 mg total) by mouth daily. 07/08/14   Antoine Poche, MD  busPIRone (BUSPAR) 5 MG tablet Take 5 mg by mouth 3 (three) times daily.    [provider]  citalopram (CELEXA) 10 MG tablet Take 20 mg by mouth daily. For depression. 04/08/13   Thermon Leyland, NP  doxycycline (VIBRAMYCIN) 100  MG capsule Take 1 capsule (100 mg total) by mouth 2 (two) times daily. 08/19/17 09/02/17  Paolo Okane, Barbara Cower, MD  gabapentin (NEURONTIN) 100 MG capsule Take 1 capsule (100 mg total) by mouth 3 (three) times daily. Take 1 capsule 3 times daily for a week if this does not offer improvement increase to 2 capsules 3 times daily for a week if this does not offer improvement increase to 3 capsules 3 times daily if this is not helping then talk to her doctor about starting a different medication. 08/19/17   Daijha Leggio, Barbara Cower, MD  hydrochlorothiazide (HYDRODIURIL) 25 MG tablet Take 1 tablet (25 mg total) by mouth daily. 07/08/14   Antoine Poche, MD  HYDROcodone-acetaminophen (NORCO/VICODIN) 5-325 MG tablet Take 1 tablet by mouth every 12 (twelve) hours as needed for severe pain. 08/19/17   Alleen Kehm, Barbara Cower, MD  ibuprofen (ADVIL,MOTRIN) 800 MG tablet Take 1 tablet (800 mg total) by mouth 3 (three) times daily. 04/07/16   Eber Hong, MD  metoprolol succinate (TOPROL-XL) 50 MG 24 hr tablet Take 50 mg by mouth 2 (two) times daily. Take with or immediately following a meal.    [provider]  metroNIDAZOLE (FLAGYL) 500 MG tablet Take 1 tablet (500  mg total) by mouth 2 (two) times daily. 08/19/17 09/02/17  Estelene Carmack, Barbara Cower, MD  predniSONE (DELTASONE) 20 MG tablet 3 tabs po daily x 3 days, then 2 tabs x 3 days, then 1.5 tabs x 3 days, then 1 tab x 3 days, then 0.5 tabs x 3 days 08/19/17   Khyran Riera, Barbara Cower, MD  promethazine-codeine (PHENERGAN WITH CODEINE) 6.25-10 MG/5ML syrup Take 5 mLs by mouth every 4 (four) hours as needed for cough. 02/07/16   Burgess Amor, PA-C  traMADol Janean Sark) 50 MG tablet 1 or 2 po q6h prn pain 05/11/17   Ivery Quale, PA-C    Family History Family History  Problem Relation Age of Onset  . Stroke Father   . Hypertension Father   . Heart failure Father   . Hyperlipidemia Father   . Hypertension Mother   . Cancer Mother        lung, brain    Social History Social History  Substance Use  Topics  . Smoking status: Never Smoker  . Smokeless tobacco: Never Used  . Alcohol use 1.2 oz/week    2 Cans of beer per week     Comment: weekly     Allergies   Patient has no known allergies.   Review of Systems Review of Systems  All other systems reviewed and are negative.    Physical Exam Updated Vital Signs BP (!) 147/101 (BP Location: Right Arm)   Pulse 70   Temp 98 F (36.7 C) (Oral)   Resp 18   Ht 5\' 5"  (1.651 m)   Wt 63.5 kg (140 lb)   LMP 03/03/2013   SpO2 97%   BMI 23.30 kg/m   Physical Exam  Constitutional: She is oriented to person, place, and time. She appears well-developed and well-nourished.  HENT:  Head: Normocephalic and atraumatic.  Eyes: Conjunctivae and EOM are normal.  Neck: Normal range of motion.  Cardiovascular: Normal rate and regular rhythm.   Pulmonary/Chest: No stridor. No respiratory distress.  Abdominal: Bowel sounds are normal. She exhibits no distension.  Genitourinary: There is erythema in the vagina. Vaginal discharge found.  Musculoskeletal: She exhibits no edema, tenderness or deformity.  Neurological: She is alert and oriented to person, place, and time. She displays normal reflexes. No cranial nerve deficit or sensory deficit. She exhibits normal muscle tone. Coordination normal.  Skin: Skin is warm and dry.  Nursing note and vitals reviewed.    ED Treatments / Results  Labs (all labs ordered are listed, but only abnormal results are displayed) Labs Reviewed  WET PREP, GENITAL - Abnormal; Notable for the following:       Result Value   WBC, Wet Prep HPF POC MANY (*)    All other components within normal limits  URINALYSIS, ROUTINE W REFLEX MICROSCOPIC  GC/CHLAMYDIA PROBE AMP ([place]) NOT AT [facility]    EKG  EKG Interpretation None       Radiology No results found.  Procedures Procedures (including critical care time)  Medications Ordered in ED Medications  gabapentin (NEURONTIN) capsule 100 mg (100  mg Oral Given 08/19/17 2147)  predniSONE (DELTASONE) tablet 60 mg (60 mg Oral Given 08/19/17 2147)  oxyCODONE-acetaminophen (PERCOCET/ROXICET) 5-325 MG per tablet 1 tablet (1 tablet Oral Given 08/19/17 2147)  cefTRIAXone (ROCEPHIN) injection 250 mg (250 mg Intramuscular Given 08/19/17 2303)  azithromycin (ZITHROMAX) powder 1 g (1 g Oral Given 08/19/17 2302)  lidocaine (PF) (XYLOCAINE) 1 % injection (2 mLs  Given 08/19/17 2303)     Initial Impression /  Assessment and Plan / ED Course  I have reviewed the triage vital signs and the nursing notes.  Pertinent labs & imaging results that were available during my care of the patient were reviewed by me and considered in my medical decision making (see chart for details).     Bilateral paresthesias that become numbness intermittently.  I suspect this is related to some type of nerve issue secondary to repetitive use at her job.  Low suspicion for central cause of the symptoms.  Suspect she also has a sexually transmitted disease or treated for PID as well.  Final Clinical Impressions(s) / ED Diagnoses   Final diagnoses:  Vaginal discharge  Nerve compression    New Prescriptions Discharge Medication List as of 08/19/2017 11:21 PM    START taking these medications   Details  doxycycline (VIBRAMYCIN) 100 MG capsule Take 1 capsule (100 mg total) by mouth 2 (two) times daily., Starting Sun 08/19/2017, Until Sun 09/02/2017, Print    gabapentin (NEURONTIN) 100 MG capsule Take 1 capsule (100 mg total) by mouth 3 (three) times daily. Take 1 capsule 3 times daily for a week if this does not offer improvement increase to 2 capsules 3 times daily for a week if this does not offer improvement increase to 3 capsules 3 times dai ly if this is not helping then talk to her doctor about starting a different medication., Starting Sun 08/19/2017, Print    HYDROcodone-acetaminophen (NORCO/VICODIN) 5-325 MG tablet Take 1 tablet by mouth every 12 (twelve) hours  as needed for severe pain., Starting Sun 08/19/2017, Print    metroNIDAZOLE (FLAGYL) 500 MG tablet Take 1 tablet (500 mg total) by mouth 2 (two) times daily., Starting Sun 08/19/2017, Until Sun 09/02/2017, Print    predniSONE (DELTASONE) 20 MG tablet 3 tabs po daily x 3 days, then 2 tabs x 3 days, then 1.5 tabs x 3 days, then 1 tab x 3 days, then 0.5 tabs x 3 days, Print         Jadalyn Oliveri, Barbara CowerJason, MD 08/20/17 0126

## 2017-08-21 LAB — GC/CHLAMYDIA PROBE AMP (~~LOC~~) NOT AT ARMC
Chlamydia: NEGATIVE
Neisseria Gonorrhea: NEGATIVE

## 2017-11-05 ENCOUNTER — Emergency Department (HOSPITAL_COMMUNITY): Payer: PRIVATE HEALTH INSURANCE

## 2017-11-05 ENCOUNTER — Emergency Department (HOSPITAL_COMMUNITY)
Admission: EM | Admit: 2017-11-05 | Discharge: 2017-11-05 | Disposition: A | Discharge status: Home / Self Care | Payer: PRIVATE HEALTH INSURANCE | Attending: Emergency Medicine | Admitting: Emergency Medicine

## 2017-11-05 ENCOUNTER — Encounter (HOSPITAL_COMMUNITY): Payer: Self-pay

## 2017-11-05 DIAGNOSIS — M25511 Pain in right shoulder: Secondary | ICD-10-CM | POA: Diagnosis not present

## 2017-11-05 DIAGNOSIS — I10 Essential (primary) hypertension: Secondary | ICD-10-CM | POA: Insufficient documentation

## 2017-11-05 DIAGNOSIS — Z79899 Other long term (current) drug therapy: Secondary | ICD-10-CM | POA: Diagnosis not present

## 2017-11-05 MED ORDER — AMLODIPINE BESYLATE 5 MG PO TABS: 5.0000 mg | ORAL_TABLET | Freq: Every day | ORAL | 1 refills | Status: AC

## 2017-11-05 MED ORDER — OXYCODONE-ACETAMINOPHEN 5-325 MG PO TABS
1.0000 | ORAL_TABLET | Freq: Once | ORAL | Status: AC
  Administered 2017-11-05: 1 via ORAL
  Filled 2017-11-05: qty 1

## 2017-11-05 MED ORDER — TRAMADOL HCL 50 MG PO TABS: 50.0000 mg | ORAL_TABLET | Freq: Four times a day (QID) | ORAL | 0 refills | Status: DC | PRN

## 2017-11-05 NOTE — Discharge Instructions (Signed)
Follow-up with Dr. Romeo AppleHarrison in a week for your shoulder.  Follow-up with a family doctor to recheck your blood pressure next couple weeks

## 2017-11-05 NOTE — ED Provider Notes (Signed)
[facility] EMERGENCY DEPARTMENT Provider Note   CSN: [id number] Arrival date & time: 11/05/17  1116     History   Chief Complaint Chief Complaint  Patient presents with  . Shoulder Pain    HPI Brandi Giles is a 52 y.o. female.  Patient complains of right shoulder pain.  She also states she is not been on her blood pressure medicine for quite some time.   The history is provided by the patient.  Shoulder Pain   This is a new problem. The current episode started more than 2 days ago. The problem occurs constantly. The problem has not changed since onset.Pain location: Right shoulder. The quality of the pain is described as dull. The pain is at a severity of 5/10. The pain is moderate. Associated symptoms include limited range of motion. The symptoms are aggravated by activity.    Past Medical History:  Diagnosis Date  . Anxiety   . Drug abuse and dependence (HCC)   . Hepatitis C   . Hypertension   . Iron deficiency anemia   . Leaky heart valve   . Murmur   . Ovarian cyst   . Tachycardia     Patient Active Problem List   Diagnosis Date Noted  . Hepatitis C antibody test positive 12/30/2015  . Burning with urination 11/17/2015  . UTI (lower urinary tract infection) 11/17/2015  . BV (bacterial vaginosis) 11/17/2015  . Hep C w/o coma, chronic (HCC) 11/04/2015  . Chronic diastolic heart failure (HCC) 07/08/2014  . Diastolic dysfunction 03/23/2014  . Heart murmur 11/20/2013  . HTN (hypertension) 11/20/2013  . Cocaine abuse with cocaine-induced mood disorder (HCC) 04/06/2013    Past Surgical History:  Procedure Laterality Date  . CESAREAN SECTION    . DILATION AND CURETTAGE OF UTERUS      OB History    Gravida Para Term Preterm AB Living   5 3 3   2 3    SAB TAB Ectopic Multiple Live Births                   Home Medications    Prior to Admission medications   Medication Sig Start Date End Date Taking? Authorizing Provider  acetaminophen (TYLENOL)  500 MG tablet Take 500 mg by mouth every 6 (six) hours as needed for mild pain or moderate pain.   Yes [provider]  amLODipine (NORVASC) 5 MG tablet Take 1 tablet (5 mg total) by mouth daily. 11/05/17   Bethann BerkshireZammit, Rudie Sermons, MD  HYDROcodone-acetaminophen (NORCO/VICODIN) 5-325 MG tablet Take 1 tablet by mouth every 12 (twelve) hours as needed for severe pain. Patient not taking: Reported on 11/05/2017 08/19/17   Mesner, Barbara CowerJason, MD  traMADol (ULTRAM) 50 MG tablet Take 1 tablet (50 mg total) by mouth every 6 (six) hours as needed. 11/05/17   Bethann BerkshireZammit, Shantanique Hodo, MD    Family History Family History  Problem Relation Age of Onset  . Stroke Father   . Hypertension Father   . Heart failure Father   . Hyperlipidemia Father   . Hypertension Mother   . Cancer Mother        lung, brain    Social History Social History   Tobacco Use  . Smoking status: Never Smoker  . Smokeless tobacco: Never Used  Substance Use Topics  . Alcohol use: Yes    Alcohol/week: 1.2 oz    Types: 2 Cans of beer per week    Comment: weekly  . Drug use: No  Allergies   Patient has no known allergies.   Review of Systems Review of Systems  Constitutional: Negative for appetite change and fatigue.  HENT: Negative for congestion, ear discharge and sinus pressure.   Eyes: Negative for discharge.  Respiratory: Negative for cough.   Cardiovascular: Negative for chest pain.  Gastrointestinal: Negative for abdominal pain and diarrhea.  Genitourinary: Negative for frequency and hematuria.  Musculoskeletal: Negative for back pain.       Right shoulder pain  Skin: Negative for rash.  Neurological: Negative for seizures and headaches.  Psychiatric/Behavioral: Negative for hallucinations.     Physical Exam Updated Vital Signs BP (!) 157/104   Pulse 77   Temp 98.2 F (36.8 C) (Oral)   Resp 18   Ht 5\' 2"  (1.575 m)   Wt 63.5 kg (140 lb)   LMP 03/03/2013   SpO2 99%   BMI 25.61 kg/m   Physical Exam    Constitutional: She is oriented to person, place, and time. She appears well-developed.  HENT:  Head: Normocephalic.  Eyes: Conjunctivae and EOM are normal. No scleral icterus.  Neck: Neck supple. No thyromegaly present.  Cardiovascular: Normal rate and regular rhythm. Exam reveals no gallop and no friction rub.  No murmur heard. Pulmonary/Chest: No stridor. She has no wheezes. She has no rales. She exhibits no tenderness.  Abdominal: She exhibits no distension. There is no tenderness. There is no rebound.  Musculoskeletal: She exhibits no edema.  Tenderness to right shoulder with decreased range of motion  Lymphadenopathy:    She has no cervical adenopathy.  Neurological: She is oriented to person, place, and time. She exhibits normal muscle tone. Coordination normal.  Skin: No rash noted. No erythema.  Psychiatric: She has a normal mood and affect. Her behavior is normal.     ED Treatments / Results  Labs (all labs ordered are listed, but only abnormal results are displayed) Labs Reviewed - No data to display  EKG  EKG Interpretation None       Radiology Dg Shoulder Right  Result Date: 11/05/2017 CLINICAL DATA:  Right shoulder pain EXAM: RIGHT SHOULDER - 2+ VIEW COMPARISON:  None FINDINGS: Degenerative changes in the [facility] joint with joint space narrowing and spurring. Glenohumeral joint is maintained. No acute bony abnormality. Specifically, no fracture, subluxation, or dislocation. Soft tissues are intact. IMPRESSION: Mild degenerative changes in the right AC joint. No acute bony abnormality. Electronically Signed   By: Charlett Nose M.D.   On: 11/05/2017 15:53    Procedures Procedures (including critical care time)  Medications Ordered in ED Medications  oxyCODONE-acetaminophen (PERCOCET/ROXICET) 5-325 MG per tablet 1 tablet (1 tablet Oral Given 11/05/17 1530)     Initial Impression / Assessment and Plan / ED Course  I have reviewed the triage vital signs and the  nursing notes.  Pertinent labs & imaging results that were available during my care of the patient were reviewed by me and considered in my medical decision making (see chart for details).    Patient with inflammation to right shoulder.  She will be placed on Ultram and referred to orthopedics.  #2 patient has uncontrolled hypertension she will be placed on amlodipine which is what she has been on before and referred to see a family doctor  Final Clinical Impressions(s) / ED Diagnoses   Final diagnoses:  Acute pain of right shoulder  Essential hypertension    ED Discharge Orders        Ordered    traMADol (ULTRAM) 50  MG tablet  Every 6 hours PRN     11/05/17 1635    amLODipine (NORVASC) 5 MG tablet  Daily     11/05/17 1635       Bethann Berkshire, MD 11/05/17 1640

## 2017-11-05 NOTE — ED Triage Notes (Signed)
Pt reports right shoulder pain that radiates up the back of neck. Pt had a job with repetitive motion. Pain has been present apprx one month, but has increased over the last several days . Difficulty turning neck due to shooting pain

## 2017-11-23 ENCOUNTER — Other Ambulatory Visit: Payer: Self-pay

## 2017-11-23 ENCOUNTER — Encounter (HOSPITAL_COMMUNITY): Payer: Self-pay | Admitting: Emergency Medicine

## 2017-11-23 ENCOUNTER — Emergency Department (HOSPITAL_COMMUNITY): Payer: PRIVATE HEALTH INSURANCE

## 2017-11-23 ENCOUNTER — Emergency Department (HOSPITAL_COMMUNITY)
Admission: EM | Admit: 2017-11-23 | Discharge: 2017-11-23 | Disposition: A | Discharge status: Home / Self Care | Payer: PRIVATE HEALTH INSURANCE | Attending: Emergency Medicine | Admitting: Emergency Medicine

## 2017-11-23 DIAGNOSIS — I110 Hypertensive heart disease with heart failure: Secondary | ICD-10-CM | POA: Diagnosis not present

## 2017-11-23 DIAGNOSIS — M542 Cervicalgia: Secondary | ICD-10-CM | POA: Diagnosis not present

## 2017-11-23 DIAGNOSIS — I5032 Chronic diastolic (congestive) heart failure: Secondary | ICD-10-CM | POA: Insufficient documentation

## 2017-11-23 HISTORY — DX: Unspecified osteoarthritis, unspecified site: M19.90

## 2017-11-23 MED ORDER — TRAMADOL HCL 50 MG PO TABS: 50.0000 mg | ORAL_TABLET | Freq: Four times a day (QID) | ORAL | 0 refills | Status: DC | PRN

## 2017-11-23 MED ORDER — PREDNISONE 10 MG PO TABS: ORAL_TABLET | ORAL | 0 refills | Status: DC

## 2017-11-23 MED ORDER — HYDROCODONE-ACETAMINOPHEN 5-325 MG PO TABS
1.0000 | ORAL_TABLET | Freq: Once | ORAL | Status: AC
  Administered 2017-11-23: 1 via ORAL
  Filled 2017-11-23: qty 1

## 2017-11-23 NOTE — ED Provider Notes (Signed)
[facility]ERGENC[facility]PART[facility] Provider Note   CSN: [id number] Arrival date & time: 11/23/17  [id number]     History   Chief Complaint Chief Complaint  Patient presents with  . Neck Pain    HPI Brandi Giles is a 52 y.o. female.  HPI   Brandi Giles is a 52 y.o. female with history of hypertension and hepatitis C, who presents to the Emergency Department complaining of right-sided neck pain for 1 month.  Denies known injury.  She states the pain radiates to her right shoulder on occasion.  She states the pain is worse with turning her head to the right.  Was seen here several weeks ago with pain to the shoulder and diagnosed with arthritis.  She states the medication helps with her pain but only temporary.  She denies fever, headaches, numbness or weakness of the upper extremities and chest pain.    Past Medical History:  Diagnosis Date  . Anxiety   . Arthritis   . Drug abuse and dependence (HCC)   . Hepatitis C   . Hypertension   . Iron deficiency anemia   . Leaky heart valve   . Murmur   . Ovarian cyst   . Tachycardia     Patient Active Problem List   Diagnosis Date Noted  . Hepatitis C antibody test positive 12/30/2015  . Burning with urination 11/17/2015  . UTI (lower urinary tract infection) 11/17/2015  . BV (bacterial vaginosis) 11/17/2015  . Hep C w/o coma, chronic (HCC) 11/04/2015  . Chronic diastolic heart failure (HCC) 07/08/2014  . Diastolic dysfunction 03/23/2014  . Heart murmur 11/20/2013  . HTN (hypertension) 11/20/2013  . Cocaine abuse with cocaine-induced mood disorder (HCC) 04/06/2013    Past Surgical History:  Procedure Laterality Date  . CESAREAN SECTION    . DILATION AND CURETTAGE OF UTERUS      OB History    Gravida Para Term Preterm AB Living   5 3 3   2 3    SAB TAB Ectopic Multiple Live Births                   Home Medications    Prior to Admission medications   Medication Sig Start Date End Date Taking? Authorizing  Provider  acetaminophen (TYLENOL) 500 MG tablet Take 500 mg by mouth every 6 (six) hours as needed for mild pain or moderate pain.    [provider]  amLODipine (NORVASC) 5 MG tablet Take 1 tablet (5 mg total) by mouth daily. 11/05/17   Bethann BerkshireZammit, Joseph, MD  HYDROcodone-acetaminophen (NORCO/VICODIN) 5-325 MG tablet Take 1 tablet by mouth every 12 (twelve) hours as needed for severe pain. Patient not taking: Reported on 11/05/2017 08/19/17   Mesner, Barbara CowerJason, MD  traMADol (ULTRAM) 50 MG tablet Take 1 tablet (50 mg total) by mouth every 6 (six) hours as needed. 11/05/17   Bethann BerkshireZammit, Joseph, MD    Family History Family History  Problem Relation Age of Onset  . Stroke Father   . Hypertension Father   . Heart failure Father   . Hyperlipidemia Father   . Hypertension Mother   . Cancer Mother        lung, brain    Social History Social History   Tobacco Use  . Smoking status: Never Smoker  . Smokeless tobacco: Never Used  Substance Use Topics  . Alcohol use: Yes    Alcohol/week: 1.2 oz    Types: 2 Cans of beer per week  Comment: weekly  . Drug use: No     Allergies   Patient has no known allergies.   Review of Systems Review of Systems  Constitutional: Negative for chills and fever.  HENT: Negative for sore throat and trouble swallowing.   Respiratory: Negative for shortness of breath.   Cardiovascular: Negative for chest pain.  Gastrointestinal: Negative for nausea and vomiting.  Musculoskeletal: Positive for neck pain. Negative for arthralgias, joint swelling and neck stiffness.  Skin: Positive for color change.       Abscess   Neurological: Negative for dizziness, weakness, numbness and headaches.  Hematological: Negative for adenopathy.  All other systems reviewed and are negative.    Physical Exam Updated Vital Signs BP (!) 138/93   Pulse (!) 59   Temp 97.9 F (36.6 C) (Oral)   Resp 18   Ht 5\' 2"  (1.575 m)   Wt 63.5 kg (140 lb)   LMP 03/03/2013   SpO2 100%    BMI 25.61 kg/m   Physical Exam  Constitutional: She is oriented to person, place, and time. She appears well-developed and well-nourished. No distress.  HENT:  Head: Atraumatic.  Mouth/Throat: Oropharynx is clear and moist.  Neck: Normal range of motion. No JVD present. No tracheal deviation present.  Cardiovascular: Normal rate, regular rhythm and intact distal pulses.  No murmur heard. Pulmonary/Chest: Effort normal and breath sounds normal. She exhibits no tenderness.  Musculoskeletal: Normal range of motion. She exhibits tenderness.       Cervical back: She exhibits tenderness.       Back:  Focal ttp of the lower cervical spine and right paraspinal muscles.  5/5 upper extremity motor strength bilaterally  Lymphadenopathy:    She has no cervical adenopathy.  Neurological: She is alert and oriented to person, place, and time. No sensory deficit.  Skin: Skin is warm. Capillary refill takes less than 2 seconds. No rash noted.  Psychiatric: She has a normal mood and affect.  Nursing note and vitals reviewed.    ED Treatments / Results  Labs (all labs ordered are listed, but only abnormal results are displayed) Labs Reviewed - No data to display  EKG  EKG Interpretation None       Radiology Dg Cervical Spine Complete  Result Date: 11/23/2017 CLINICAL DATA:  Posterior neck pain radiating into the right shoulder EXAM: CERVICAL SPINE - COMPLETE 4+ VIEW COMPARISON:  04/07/2016 FINDINGS: Seven cervical segments are well visualized. Vertebral body height is well maintained. Disc space narrowing at C5-6 is noted. Anterior osteophytes are noted at C5-6 and C6-7 similar to that seen on prior CT examination. Facet hypertrophic changes are noted at multiple levels. Neural foraminal narrowing is noted more marked on the right particularly at C5-6 and C6-7. No soft tissue changes are seen. The odontoid is within normal limits. IMPRESSION: Degenerative change as described. Electronically  Signed   By: Alcide Clever M.D.   On: 11/23/2017 10:54    Procedures Procedures (including critical care time)  Medications Ordered in ED Medications  HYDROcodone-acetaminophen (NORCO/VICODIN) 5-325 MG per tablet 1 tablet (not administered)     Initial Impression / Assessment and Plan / ED Course  I have reviewed the triage vital signs and the nursing notes.  Pertinent labs & imaging results that were available during my care of the patient were reviewed by me and considered in my medical decision making (see chart for details).     X-rays reviewed and discussed with patient.  No focal neuro deficits on  exam.  No radicular symptoms.  likely related to degenerative changes.  Patient considered safe for discharge home.  Pt agrees to treatment plan.  Referral information provided  Final Clinical Impressions(s) / ED Diagnoses   Final diagnoses:  Neck pain    ED Discharge Orders    None       Rosey Bath 11/23/17 2126    Blane Ohara, MD 11/27/17 0930

## 2017-11-23 NOTE — Discharge Instructions (Signed)
Alternate ice and heat to your neck.  Contact 1 of the providers listed to arrange a follow-up appointment in 1 week if not improving.

## 2017-11-23 NOTE — ED Triage Notes (Signed)
Patient c/o neck pain x1 month. Denies any known injury. Patient states occasionally radiates into right shoulder. Patient seen here in ED for pain and had x-rays, diagnosed with arthritis. Patient given tramadol with some relief.

## 2017-12-04 ENCOUNTER — Emergency Department (HOSPITAL_COMMUNITY)
Admission: EM | Admit: 2017-12-04 | Discharge: 2017-12-04 | Disposition: A | Discharge status: Home / Self Care | Payer: PRIVATE HEALTH INSURANCE | Attending: Emergency Medicine | Admitting: Emergency Medicine

## 2017-12-04 ENCOUNTER — Other Ambulatory Visit: Payer: Self-pay

## 2017-12-04 ENCOUNTER — Encounter (HOSPITAL_COMMUNITY): Payer: Self-pay | Admitting: Emergency Medicine

## 2017-12-04 DIAGNOSIS — M542 Cervicalgia: Secondary | ICD-10-CM | POA: Diagnosis present

## 2017-12-04 DIAGNOSIS — Z79899 Other long term (current) drug therapy: Secondary | ICD-10-CM | POA: Diagnosis not present

## 2017-12-04 DIAGNOSIS — M62838 Other muscle spasm: Secondary | ICD-10-CM | POA: Diagnosis not present

## 2017-12-04 DIAGNOSIS — I10 Essential (primary) hypertension: Secondary | ICD-10-CM | POA: Diagnosis not present

## 2017-12-04 DIAGNOSIS — M5030 Other cervical disc degeneration, unspecified cervical region: Secondary | ICD-10-CM | POA: Insufficient documentation

## 2017-12-04 MED ORDER — DIAZEPAM 5 MG PO TABS
10.0000 mg | ORAL_TABLET | Freq: Once | ORAL | Status: AC
  Administered 2017-12-04: 10 mg via ORAL
  Filled 2017-12-04: qty 2

## 2017-12-04 MED ORDER — CYCLOBENZAPRINE HCL 10 MG PO TABS: 10.0000 mg | ORAL_TABLET | Freq: Three times a day (TID) | ORAL | 0 refills | Status: DC

## 2017-12-04 MED ORDER — ONDANSETRON HCL 4 MG PO TABS
4.0000 mg | ORAL_TABLET | Freq: Once | ORAL | Status: AC
  Administered 2017-12-04: 4 mg via ORAL
  Filled 2017-12-04: qty 1

## 2017-12-04 MED ORDER — HYDROCODONE-IBUPROFEN 7.5-200 MG PO TABS: 1.0000 | ORAL_TABLET | Freq: Four times a day (QID) | ORAL | 0 refills | Status: DC | PRN

## 2017-12-04 MED ORDER — HYDROCODONE-ACETAMINOPHEN 5-325 MG PO TABS
2.0000 | ORAL_TABLET | Freq: Once | ORAL | Status: AC
  Administered 2017-12-04: 2 via ORAL
  Filled 2017-12-04: qty 2

## 2017-12-04 MED ORDER — DEXAMETHASONE SODIUM PHOSPHATE 10 MG/ML IJ SOLN
10.0000 mg | Freq: Once | INTRAMUSCULAR | Status: AC
Start: 2017-12-04 — End: 2017-12-04
  Administered 2017-12-04: 10 mg via INTRAMUSCULAR
  Filled 2017-12-04: qty 1

## 2017-12-04 MED ORDER — DEXAMETHASONE 4 MG PO TABS: 4.0000 mg | ORAL_TABLET | Freq: Two times a day (BID) | ORAL | 0 refills | Status: DC

## 2017-12-04 NOTE — ED Provider Notes (Signed)
[facility] EMERGENCY DEPARTMENT Provider Note   CSN: [id number] Arrival date & time: 12/04/17  9528     History   Chief Complaint Chief Complaint  Patient presents with  . Neck Pain    HPI Brandi Giles is a 52 y.o. female.  Patient is a 52 year old female who presents to the emergency department with a complaint of neck pain.  Patient has a history of hepatitis C, hypertension, heart valvular issue, and arthritis.  Patient states that she has been having problems with her neck and shoulder off and on for several months.  She was most recently diagnosed with arthritis and some degenerative disc disease changes.  She has been trying to manage this conservatively.  But she states now it is interfering with her rest and it is interfering with her ability to do her job.  She was treated with Ultram recently for pain but she states this is not helping her at all and the pain seems to be getting worse instead of better.  She is not losing control of her upper extremities or her lower extremities.  She denies any loss of control of bowel or bladder function, but states that the pain is getting worse and at times it feels like repeated shooting electric shocks going from her neck up to the back of her skull and sometime out to her shoulder.  She presents now for assistance with her pain.   The history is provided by the patient.  Neck Pain   This is a recurrent problem. Pertinent negatives include no photophobia and no chest pain.    Past Medical History:  Diagnosis Date  . Anxiety   . Arthritis   . Drug abuse and dependence (HCC)   . Hepatitis C   . Hypertension   . Iron deficiency anemia   . Leaky heart valve   . Murmur   . Ovarian cyst   . Tachycardia     Patient Active Problem List   Diagnosis Date Noted  . Hepatitis C antibody test positive 12/30/2015  . Burning with urination 11/17/2015  . UTI (lower urinary tract infection) 11/17/2015  . BV (bacterial vaginosis)  11/17/2015  . Hep C w/o coma, chronic (HCC) 11/04/2015  . Chronic diastolic heart failure (HCC) 07/08/2014  . Diastolic dysfunction 03/23/2014  . Heart murmur 11/20/2013  . HTN (hypertension) 11/20/2013  . Cocaine abuse with cocaine-induced mood disorder (HCC) 04/06/2013    Past Surgical History:  Procedure Laterality Date  . CESAREAN SECTION    . DILATION AND CURETTAGE OF UTERUS      OB History    Gravida Para Term Preterm AB Living   5 3 3   2 3    SAB TAB Ectopic Multiple Live Births                   Home Medications    Prior to Admission medications   Medication Sig Start Date End Date Taking? Authorizing Provider  amLODipine (NORVASC) 5 MG tablet Take 1 tablet (5 mg total) by mouth daily. 11/05/17   Bethann Berkshire, MD  predniSONE (DELTASONE) 10 MG tablet Take 6 tablets day one, 5 tablets day two, 4 tablets day three, 3 tablets day four, 2 tablets day five, then 1 tablet day six 11/23/17   Triplett, Tammy, PA-C  traMADol (ULTRAM) 50 MG tablet Take 1 tablet (50 mg total) by mouth every 6 (six) hours as needed. 11/23/17   Pauline Aus, PA-C    Family History Family  History  Problem Relation Age of Onset  . Stroke Father   . Hypertension Father   . Heart failure Father   . Hyperlipidemia Father   . Hypertension Mother   . Cancer Mother        lung, brain    Social History Social History   Tobacco Use  . Smoking status: Never Smoker  . Smokeless tobacco: Never Used  Substance Use Topics  . Alcohol use: Yes    Alcohol/week: 1.2 oz    Types: 2 Cans of beer per week    Comment: weekly  . Drug use: No     Allergies   Patient has no known allergies.   Review of Systems Review of Systems  Constitutional: Negative for activity change.       All ROS Neg except as noted in HPI  HENT: Negative for nosebleeds.   Eyes: Negative for photophobia and discharge.  Respiratory: Negative for cough, shortness of breath and wheezing.   Cardiovascular: Negative for  chest pain and palpitations.  Gastrointestinal: Negative for abdominal pain and blood in stool.  Genitourinary: Negative for dysuria, frequency and hematuria.  Musculoskeletal: Positive for arthralgias, myalgias and neck pain. Negative for back pain.  Skin: Negative.   Neurological: Negative for dizziness, seizures and speech difficulty.  Psychiatric/Behavioral: Negative for confusion and hallucinations. The patient is nervous/anxious.      Physical Exam Updated Vital Signs BP (!) 154/103   Pulse 80   Temp 98.4 F (36.9 C)   Resp (!) 22   Ht 5\' 2"  (1.575 m)   Wt 63.5 kg (140 lb)   LMP 03/03/2013   SpO2 100%   BMI 25.61 kg/m   Physical Exam  Constitutional: She appears well-developed and well-nourished. No distress.  HENT:  Head: Normocephalic and atraumatic.  Right Ear: External ear normal.  Left Ear: External ear normal.  Eyes: Conjunctivae are normal. Right eye exhibits no discharge. Left eye exhibits no discharge. No scleral icterus.  Neck: Neck supple. No tracheal deviation present.  Cardiovascular: Normal rate, regular rhythm and intact distal pulses.  Pulmonary/Chest: Effort normal and breath sounds normal. No stridor. No respiratory distress. She has no wheezes. She has no rales.  Abdominal: Soft. Bowel sounds are normal. She exhibits no distension. There is no tenderness. There is no rebound and no guarding.  Musculoskeletal: She exhibits no edema or tenderness.       Cervical back: She exhibits decreased range of motion, pain and spasm.       Back:  Neurological: She is alert. She has normal strength. No cranial nerve deficit (no facial droop, extraocular movements intact, no slurred speech) or sensory deficit. She exhibits normal muscle tone. She displays no seizure activity. Coordination normal.  Grip is symmetrical.  Strength is symmetrical.  There is no atrophy of the thenar eminence.  No sensory deficits appreciated of the upper extremity.  Pain on the right  greater than the left with attempted range of motion and with palpation of the cervical area.  Skin: Skin is warm and dry. No rash noted.  Psychiatric: She has a normal mood and affect.  Nursing note and vitals reviewed.    ED Treatments / Results  Labs (all labs ordered are listed, but only abnormal results are displayed) Labs Reviewed - No data to display  EKG  EKG Interpretation None       Radiology No results found.  Procedures Procedures (including critical care time)  Medications Ordered in ED Medications - No data  to display   Initial Impression / Assessment and Plan / ED Course  I have reviewed the triage vital signs and the nursing notes.  Pertinent labs & imaging results that were available during my care of the patient were reviewed by me and considered in my medical decision making (see chart for details).       Final Clinical Impressions(s) / ED Diagnoses  MDM Patient has been evaluated and had x-rays of the cervical spine.  She is noted to have osteophytes, degenerative changes, and degenerative disc disease changes with narrowing of the disc space in the the cervical spine area.  No gross neurologic deficit appreciated at this time.  No motor or sensory deficits appreciated.  The patient was referred to orthopedics and neurology on a previous evaluation.  I strongly encouraged the patient to see the specialist for assistance with this issue.  Prescription for steroids, muscle relaxer, and hydrocodone/ibuprofen given to the patient to use every 6 hours.  Patient is in agreement with this plan.   Final diagnoses:  DDD (degenerative disc disease), cervical  Muscle spasms of neck    ED Discharge Orders        Ordered    cyclobenzaprine (FLEXERIL) 10 MG tablet  3 times daily     12/04/17 0916    HYDROcodone-ibuprofen (VICOPROFEN) 7.5-200 MG tablet  Every 6 hours PRN     12/04/17 0916    dexamethasone (DECADRON) 4 MG tablet  2 times daily with meals       12/04/17 0916       Ivery QualeBryant, Karyssa Amaral, PA-C 12/04/17 [id number]    Raeford RazorKohut, Stephen, MD 12/04/17 1118

## 2017-12-04 NOTE — ED Triage Notes (Signed)
Pt c/o neck pain intermittently over the last 2 months and has been seen here for the same.

## 2017-12-04 NOTE — Discharge Instructions (Signed)
Your examination today suggests that you are having muscle spasms, there is probably being aggravated by your degenerative disc disease as well as arthritis in your neck.  It is important that you contact the orthopedic specialist, and/or the neurology specialist you were referred to on your last visit.  Heating pad to the area may be helpful.  Please use Flexeril 3 times daily for spasm, please use the Decadron 2 times daily with food.  May use the hydrocodone/ibuprofen every 6 hours for severe pain.  Flexeril and hydrocodone may cause drowsiness, please do not drive a vehicle, operate machinery, drink alcohol, or participate in activities requiring concentration when taking this medication.  Please see the orthopedic specialist or the neurology specialist as soon as possible.

## 2019-07-15 ENCOUNTER — Other Ambulatory Visit: Payer: Self-pay

## 2019-07-15 ENCOUNTER — Ambulatory Visit (INDEPENDENT_AMBULATORY_CARE_PROVIDER_SITE_OTHER): Payer: BC Managed Care – PPO | Admitting: Obstetrics and Gynecology

## 2019-07-15 ENCOUNTER — Encounter: Payer: Self-pay | Admitting: Obstetrics and Gynecology

## 2019-07-15 VITALS — Ht 65.0 in | Wt 128.4 lb

## 2019-07-15 DIAGNOSIS — N898 Other specified noninflammatory disorders of vagina: Secondary | ICD-10-CM

## 2019-07-15 LAB — POCT WET PREP (WET MOUNT): WBC, Wet Prep HPF POC: POSITIVE

## 2019-07-15 MED ORDER — METRONIDAZOLE 500 MG PO TABS: 500.0000 mg | ORAL_TABLET | Freq: Two times a day (BID) | ORAL | 0 refills | Status: AC

## 2019-07-15 MED ORDER — PREMARIN 0.625 MG/GM VA CREA: 0.5000 g | TOPICAL_CREAM | VAGINAL | 2 refills | Status: DC

## 2019-07-15 NOTE — Progress Notes (Signed)
Patient ID: Brandi Giles, female   DOB: 1966/02/11, 53 y.o.   MRN: [id number]   [facility] Visit  @DATE @            Patient name: Brandi Giles MRN [id number]  Date of birth: April 30, 1966  CC & HPI:  ANALYSE ANGST is a 53 y.o. female  NEW GYN presenting today for vaginal discharge w/ odor. Describes it as thick yellowish green x1 week. Is susceptible to yeast infections, frequent and urgent urination and currently has to wear an adult diaper. Now has some lower abdominal pain. Minimal itching. No new sexual partner, no birth control has had tubes tied.  ROS:  ROS   Pertinent History Reviewed:   Reviewed: Medical         Past Medical History:  Diagnosis Date  . Anxiety   . Arthritis   . Drug abuse and dependence ([place])   . Hepatitis C   . Hypertension   . Iron deficiency anemia   . Leaky heart valve   . Murmur   . Ovarian cyst   . Tachycardia                               Surgical Hx:    Past Surgical History:  Procedure Laterality Date  . CESAREAN SECTION    . DILATION AND CURETTAGE OF UTERUS     Medications: Reviewed & Updated - see associated section                       Current Outpatient Medications:  .  cyclobenzaprine (FLEXERIL) 10 MG tablet, Take 1 tablet (10 mg total) by mouth 3 (three) times daily. (Patient taking differently: Take 10 mg by mouth as needed. ), Disp: 20 tablet, Rfl: 0 .  amLODipine (NORVASC) 5 MG tablet, Take 1 tablet (5 mg total) by mouth daily., Disp: 30 tablet, Rfl: 1   Social History: Reviewed -  reports that she has never smoked. She has never used smokeless tobacco.  Objective Findings:  Vitals: Height 5\' 5"  (1.651 m), weight 128 lb 6.4 oz (58.2 kg), last menstrual period 03/03/2013.  PHYSICAL EXAMINATION General appearance - alert, well appearing, and in no distress Mental status - alert, oriented to person, place, and time, normal mood, behavior, speech, dress, motor activity, and thought  processes  PELVIC Vagina - light yellow thick creamy discharge Cervix - cervical erythema Uterus - tender upon palpation, describes pain as continual menstrual cramps Bimanual- tender  Wet [place] - clue cells and white GCCHL collected  Assessment & Plan:   A:  1. BV 2. Post menopausal  P:  1. Metronidazole BID 7 days 2. Premarin vag cream 0.5 g twice a week 3. Reassess PID 1 week    By signing my name below, I, Brandi Giles, attest that this documentation has been prepared under the direction and in the presence of Jonnie Kind, MD. Electronically Signed: [place]. 07/15/19. 3:20 PM.  I personally performed the services described in this documentation, which was SCRIBED in my presence. The recorded information has been reviewed and considered accurate. It has been edited as necessary during review. Jonnie Kind, MD

## 2019-07-18 LAB — GC/CHLAMYDIA PROBE AMP
Chlamydia trachomatis, NAA: NEGATIVE
Neisseria Gonorrhoeae by PCR: NEGATIVE

## 2019-07-22 ENCOUNTER — Telehealth: Payer: Self-pay | Admitting: Obstetrics and Gynecology

## 2019-07-22 NOTE — Telephone Encounter (Signed)
Called patient regarding appointment and the following message was left: ° ° °We have you scheduled for an upcoming appointment at our office. At this time, patients are encouraged to come alone to their visits whenever possible, however, a support person, over age 54, may accompany you to your appointment if assistance is needed for safety or care concerns. Otherwise, support persons should remain outside until the visit is complete.  ° °We ask if you have had any exposure to anyone suspected or confirmed of having COVID-19 or if you are experiencing any of the following, to call and reschedule your appointment: fever, cough, shortness of breath, muscle pain, diarrhea, rash, vomiting, abdominal pain, red eye, weakness, bruising, bleeding, joint pain, or a severe headache.  ° °Please know we will ask you these questions or similar questions when you arrive for your appointment and again it’s how we are keeping everyone safe.   ° °Also,to keep you safe, please use the provided hand sanitizer when you enter the office. We are asking everyone in the office to wear a mask to help prevent the spread of °germs. If you have a mask of your own, please wear it to your appointment, if not, we are happy to provide one for you. ° °Thank you for understanding and your cooperation.  ° ° °[facility] Staff ° ° ° ° ° °

## 2019-07-23 ENCOUNTER — Ambulatory Visit: Payer: BC Managed Care – PPO | Admitting: Obstetrics and Gynecology

## 2019-07-30 ENCOUNTER — Telehealth: Payer: Self-pay | Admitting: Obstetrics & Gynecology

## 2019-07-30 NOTE — Telephone Encounter (Signed)
Called patient regarding appointment and the following message was left: ° ° °We have you scheduled for an upcoming appointment at our office. At this time, patients are encouraged to come alone to their visits whenever possible, however, a support person, over age 54, may accompany you to your appointment if assistance is needed for safety or care concerns. Otherwise, support persons should remain outside until the visit is complete.  ° °We ask if you have had any exposure to anyone suspected or confirmed of having COVID-19 or if you are experiencing any of the following, to call and reschedule your appointment: fever, cough, shortness of breath, muscle pain, diarrhea, rash, vomiting, abdominal pain, red eye, weakness, bruising, bleeding, joint pain, or a severe headache.  ° °Please know we will ask you these questions or similar questions when you arrive for your appointment and again it’s how we are keeping everyone safe.   ° °Also,to keep you safe, please use the provided hand sanitizer when you enter the office. We are asking everyone in the office to wear a mask to help prevent the spread of °germs. If you have a mask of your own, please wear it to your appointment, if not, we are happy to provide one for you. ° °Thank you for understanding and your cooperation.  ° ° °[facility] Staff ° ° ° ° ° °

## 2019-07-31 ENCOUNTER — Ambulatory Visit: Payer: BC Managed Care – PPO | Admitting: Obstetrics & Gynecology

## 2019-08-08 ENCOUNTER — Telehealth: Payer: Self-pay | Admitting: Obstetrics & Gynecology

## 2019-08-08 NOTE — Telephone Encounter (Signed)

## 2019-08-11 ENCOUNTER — Encounter: Payer: Self-pay | Admitting: Obstetrics & Gynecology

## 2019-08-11 ENCOUNTER — Other Ambulatory Visit: Payer: Self-pay

## 2019-08-11 ENCOUNTER — Ambulatory Visit (INDEPENDENT_AMBULATORY_CARE_PROVIDER_SITE_OTHER): Payer: BC Managed Care – PPO | Admitting: Obstetrics & Gynecology

## 2019-08-11 VITALS — BP 140/93 | HR 67 | Ht 65.0 in | Wt 130.0 lb

## 2019-08-11 DIAGNOSIS — N952 Postmenopausal atrophic vaginitis: Secondary | ICD-10-CM

## 2019-08-11 DIAGNOSIS — N730 Acute parametritis and pelvic cellulitis: Secondary | ICD-10-CM | POA: Diagnosis not present

## 2019-08-11 MED ORDER — ESTRADIOL 0.1 MG/GM VA CREA: TOPICAL_CREAM | VAGINAL | 12 refills | Status: AC

## 2019-08-11 NOTE — Progress Notes (Signed)
Chief Complaint  Patient presents with  . Follow-up    on PID; pt feels better!!!       53 y.o. [id number] Patient's last menstrual period was 03/03/2013. The current method of family planning is tubal ligation.  Outpatient Encounter Medications as of 08/11/2019  Medication Sig  . amLODipine (NORVASC) 5 MG tablet Take 1 tablet (5 mg total) by mouth daily.  Marland Kitchen estradiol (ESTRACE) 0.1 MG/GM vaginal cream Use 1 grams every other night  . [DISCONTINUED] conjugated estrogens (PREMARIN) vaginal cream Place 4.03 Applicatorfuls vaginally 3 (three) times a week. For vaginal thinning and irritation  . [DISCONTINUED] cyclobenzaprine (FLEXERIL) 10 MG tablet Take 1 tablet (10 mg total) by mouth 3 (three) times daily. (Patient taking differently: Take 10 mg by mouth as needed. )   No facility-administered encounter medications on file as of 08/11/2019.     Subjective Pt was seen 3 weeks ago for pelvic infection Pt has resolved symptoms Having discomfort with intercourse, dryness issues Vaginal premarin was too expensive, will try estrace, if not would like to try oral agents Past Medical History:  Diagnosis Date  . Anxiety   . Arthritis   . Drug abuse and dependence ([place])   . Hepatitis C   . Hypertension   . Iron deficiency anemia   . Leaky heart valve   . Murmur   . Ovarian cyst   . Tachycardia     Past Surgical History:  Procedure Laterality Date  . CESAREAN SECTION    . DILATION AND CURETTAGE OF UTERUS      OB History    Gravida  5   Para  3   Term  3   Preterm      AB  2   Living  3     SAB      TAB      Ectopic      Multiple      Live Births              No Known Allergies  Social History   Socioeconomic History  . Marital status: Single    Spouse name: Not on file  . Number of children: Not on file  . Years of education: Not on file  . Highest education level: Not on file  Occupational History  . Not on file  Social Needs  .  Financial resource strain: Not on file  . Food insecurity    Worry: Not on file    Inability: Not on file  . Transportation needs    Medical: Not on file    Non-medical: Not on file  Tobacco Use  . Smoking status: Never Smoker  . Smokeless tobacco: Never Used  Substance and Sexual Activity  . Alcohol use: Yes    Alcohol/week: 2.0 standard drinks    Types: 2 Cans of beer per week    Comment: occ  . Drug use: No    Types: "Crack" cocaine  . Sexual activity: Yes    Birth control/protection: Surgical    Comment: tubal ligation  Lifestyle  . Physical activity    Days per week: Not on file    Minutes per session: Not on file  . Stress: Not on file  Relationships  . Social Herbalist on phone: Not on file    Gets together: Not on file    Attends religious service: Not on file    Active member of club or organization: Not  on file    Attends meetings of clubs or organizations: Not on file    Relationship status: Not on file  Other Topics Concern  . Not on file  Social History Narrative  . Not on file    Family History  Problem Relation Age of Onset  . Stroke Father   . Hypertension Father   . Heart failure Father   . Hyperlipidemia Father   . Hypertension Mother   . Cancer Mother        lung, brain    Medications:       Current Outpatient Medications:  .  amLODipine (NORVASC) 5 MG tablet, Take 1 tablet (5 mg total) by mouth daily., Disp: 30 tablet, Rfl: 1 .  estradiol (ESTRACE) 0.1 MG/GM vaginal cream, Use 1 grams every other night, Disp: 30 g, Rfl: 12  Objective Blood pressure (!) 140/93, pulse 67, height 5\' 5"  (1.651 m), weight 130 lb (59 kg), last menstrual period 03/03/2013.  General WDWN female NAD Vulva:  normal appearing vulva with no masses, tenderness or lesions Vagina:  normal mucosa, no discharge Cervix:  Normal no lesions Uterus:  normal size, contour, position, consistency, mobility, non-tender Adnexa: ovaries:present,  normal adnexa in  size, nontender and no masses   Pertinent ROS No burning with urination, frequency or urgency No nausea, vomiting or diarrhea Nor fever chills or other constitutional symptoms   Labs or studies No new    Impression Diagnoses this Encounter::   ICD-10-CM   1. PID (acute pelvic inflammatory disease)  N73.0    resolved  2. Atrophic vaginitis  N95.2    estarce vaginal cream    Established relevant diagnosis(es):   Plan/Recommendations: Meds ordered this encounter  Medications  . estradiol (ESTRACE) 0.1 MG/GM vaginal cream    Sig: Use 1 grams every other night    Dispense:  30 g    Refill:  12    Labs or Scans Ordered: No orders of the defined types were placed in this encounter.   Management:: >resolved pelvic infection >see if estrace is cheaper for her atrophic vaginitis  Follow up Return if symptoms worsen or fail to improve.     All questions were answered.

## 2020-01-16 ENCOUNTER — Ambulatory Visit: Payer: BC Managed Care – PPO | Attending: Internal Medicine

## 2020-01-16 ENCOUNTER — Other Ambulatory Visit: Payer: Self-pay

## 2020-01-16 DIAGNOSIS — Z20822 Contact with and (suspected) exposure to covid-19: Secondary | ICD-10-CM | POA: Diagnosis not present

## 2020-01-17 LAB — NOVEL CORONAVIRUS, NAA: SARS-CoV-2, NAA: NOT DETECTED

## 2021-06-15 ENCOUNTER — Ambulatory Visit (INDEPENDENT_AMBULATORY_CARE_PROVIDER_SITE_OTHER): Payer: Self-pay | Admitting: Orthopedic Surgery

## 2021-06-15 ENCOUNTER — Ambulatory Visit: Payer: Medicaid Other

## 2021-06-15 ENCOUNTER — Other Ambulatory Visit: Payer: Self-pay

## 2021-06-15 ENCOUNTER — Encounter: Payer: Self-pay | Admitting: Orthopedic Surgery

## 2021-06-15 VITALS — BP 148/99 | HR 66 | Ht 65.0 in | Wt 133.8 lb

## 2021-06-15 DIAGNOSIS — M25561 Pain in right knee: Secondary | ICD-10-CM

## 2021-06-15 NOTE — Patient Instructions (Signed)

## 2021-06-15 NOTE — Progress Notes (Signed)
New Patient Visit  Assessment: Brandi Giles is a 55 y.o. female with the following: 1. Acute pain of right knee; arthritis flare vs mensicus injury  Plan: Patient's right knee is very painful, does have an effusion on exam today.  She was previously given antibiotics for a possible septic knee.  However, I do not think that she has an infection in her right knee.  On physical exam, she has diffuse tenderness, and has difficulty walking.  However, she does have excellent range of motion.  Her injury was sustained atraumatically.  This is less likely a meniscus injury as a result.  No previous injury to her knee, when she was younger.  Radiographs demonstrate some degenerative changes, specifically within the patellofemoral compartment.  We discussed potential treatment options, and she is taking the appropriate medications and naproxen.  We discussed possibility of proceeding with a steroid injection, she would like to proceed.   Procedure note injection Right knee joint   Verbal consent was obtained to inject the right knee joint  Timeout was completed to confirm the site of injection.  The skin was prepped with alcohol and ethyl chloride was sprayed at the injection site.  A 21-gauge needle was used to inject 40 mg of Depo-Medrol and 1% lidocaine (3 cc) into the right knee using an anterolateral approach.  There were no complications. A sterile bandage was applied.   Follow-up: Return if symptoms worsen or fail to improve.  Subjective:  Chief Complaint  Patient presents with   Knee Pain    Right knee//x 1 mth//no known injury//pt says she just woke up one day and it was swollen and painful    History of Present Illness: Brandi Giles is a 55 y.o. female who presents for evaluation of her right knee.  Pain has been ongoing for the past month.  No specific injury.  She states she just woke up 1 morning and her knee was painful and swollen.  She has never injured her knee  before.  She presented to the emergency department, and she was given some NSAIDs, and antibiotics for possible septic knee.  No previous injections.  She notes a locking type sensation in her right knee.  She is using a cane to assist with ambulation.  She notes swelling of her right knee, but this has improved.   Review of Systems: No fevers or chills No numbness or tingling No chest pain No shortness of breath No bowel or bladder dysfunction No GI distress No headaches   Medical History:  Past Medical History:  Diagnosis Date   Anxiety    Arthritis    Drug abuse and dependence (HCC)    Hepatitis C    Hypertension    Iron deficiency anemia    Leaky heart valve    Murmur    Ovarian cyst    Tachycardia     Past Surgical History:  Procedure Laterality Date   CESAREAN SECTION     DILATION AND CURETTAGE OF UTERUS      Family History  Problem Relation Age of Onset   Stroke Father    Hypertension Father    Heart failure Father    Hyperlipidemia Father    Hypertension Mother    Cancer Mother        lung, brain   Social History   Tobacco Use   Smoking status: Never   Smokeless tobacco: Never  Vaping Use   Vaping Use: Never used  Substance Use Topics  Alcohol use: Yes    Alcohol/week: 2.0 standard drinks    Types: 2 Cans of beer per week    Comment: occ   Drug use: No    Types: "Crack" cocaine    No Known Allergies  Current Meds  Medication Sig   amLODipine (NORVASC) 5 MG tablet Take 1 tablet (5 mg total) by mouth daily.   estradiol (ESTRACE) 0.1 MG/GM vaginal cream Use 1 grams every other night   naproxen (NAPROSYN) 500 MG tablet Take 500 mg by mouth 2 (two) times daily.   sulfamethoxazole-trimethoprim (BACTRIM DS) 800-160 MG tablet Take 2 tablets by mouth 2 (two) times daily.    Objective: BP (!) 148/99   Pulse 66   Ht 5\' 5"  (1.651 m)   Wt 133 lb 12.8 oz (60.7 kg)   LMP 03/03/2013   BMI 22.27 kg/m   Physical Exam:  General: Alert and  oriented. and No acute distress. Gait: Ambulates with the assistance of a cane.  Right knee is swollen.  She has diffuse tenderness to palpation.  She does have near full range of motion, but it is uncomfortable.  Remainder of exam is limited due to tenderness.  She does have a Baker's cyst, and this is tender to palpation as well.  Sensation is intact distally.  Toes are warm and well-perfused.  IMAGING: I personally ordered and reviewed the following images   X-rays of the right hand were obtained in clinic today and demonstrates neutral overall alignment.  Well-maintained medial and lateral compartment joint spaces.  There are some osteophytes within these compartments.  Lateral tilt of the patella, with noticeable osteophytes.  No acute injury.  Impression: Right knee x-ray with mild degenerative changes, most prominent within the patellofemoral compartment.  New Medications:  No orders of the defined types were placed in this encounter.     05/03/2013, MD  06/15/2021 3:48 PM

## 2023-02-20 ENCOUNTER — Telehealth: Payer: Self-pay

## 2023-02-20 NOTE — Telephone Encounter (Signed)
LVM for patient to call back to schedule PCP apt. AS, CMA
